# Patient Record
Sex: Male | Born: 1960 | Race: White | Hispanic: No | Marital: Single | Smoking: Never smoker
Health system: Southern US, Community
[De-identification: ages and names within clinical notes are randomized; demographics above are authoritative.]

## PROBLEM LIST (undated history)

## (undated) DIAGNOSIS — I10 Essential (primary) hypertension: Secondary | ICD-10-CM

## (undated) DIAGNOSIS — Q2381 Bicuspid aortic valve: Secondary | ICD-10-CM

## (undated) DIAGNOSIS — E785 Hyperlipidemia, unspecified: Secondary | ICD-10-CM

## (undated) DIAGNOSIS — K449 Diaphragmatic hernia without obstruction or gangrene: Secondary | ICD-10-CM

## (undated) DIAGNOSIS — I517 Cardiomegaly: Secondary | ICD-10-CM

## (undated) DIAGNOSIS — N200 Calculus of kidney: Secondary | ICD-10-CM

## (undated) DIAGNOSIS — K219 Gastro-esophageal reflux disease without esophagitis: Secondary | ICD-10-CM

## (undated) DIAGNOSIS — Q231 Congenital insufficiency of aortic valve: Secondary | ICD-10-CM

## (undated) DIAGNOSIS — E7800 Pure hypercholesterolemia, unspecified: Secondary | ICD-10-CM

## (undated) DIAGNOSIS — Z87442 Personal history of urinary calculi: Secondary | ICD-10-CM

## (undated) HISTORY — DX: Cardiomegaly: I51.7

## (undated) HISTORY — DX: Congenital insufficiency of aortic valve: Q23.1

## (undated) HISTORY — DX: Gastro-esophageal reflux disease without esophagitis: K21.9

## (undated) HISTORY — DX: Essential (primary) hypertension: I10

## (undated) HISTORY — DX: Hyperlipidemia, unspecified: E78.5

## (undated) HISTORY — DX: Pure hypercholesterolemia, unspecified: E78.00

## (undated) HISTORY — PX: CATARACT EXTRACTION: SUR2

## (undated) HISTORY — DX: Calculus of kidney: N20.0

## (undated) HISTORY — DX: Bicuspid aortic valve: Q23.81

## (undated) HISTORY — DX: Diaphragmatic hernia without obstruction or gangrene: K44.9

---

## 2002-06-28 ENCOUNTER — Encounter: Payer: Self-pay | Admitting: *Deleted

## 2002-06-28 ENCOUNTER — Emergency Department (HOSPITAL_COMMUNITY): Admission: EM | Admit: 2002-06-28 | Discharge: 2002-06-29 | Payer: Self-pay | Admitting: Emergency Medicine

## 2005-02-01 ENCOUNTER — Emergency Department (HOSPITAL_COMMUNITY): Admission: EM | Admit: 2005-02-01 | Discharge: 2005-02-01 | Payer: Self-pay

## 2005-11-09 ENCOUNTER — Emergency Department (HOSPITAL_COMMUNITY): Admission: EM | Admit: 2005-11-09 | Discharge: 2005-11-09 | Payer: Self-pay | Admitting: Emergency Medicine

## 2005-11-22 ENCOUNTER — Emergency Department (HOSPITAL_COMMUNITY): Admission: EM | Admit: 2005-11-22 | Discharge: 2005-11-22 | Payer: Self-pay | Admitting: Emergency Medicine

## 2005-11-23 HISTORY — PX: CARDIOVASCULAR STRESS TEST: SHX262

## 2005-11-29 ENCOUNTER — Encounter: Admission: RE | Admit: 2005-11-29 | Discharge: 2005-11-29 | Payer: Self-pay | Admitting: Gastroenterology

## 2006-02-10 ENCOUNTER — Emergency Department (HOSPITAL_COMMUNITY): Admission: EM | Admit: 2006-02-10 | Discharge: 2006-02-10 | Payer: Self-pay | Admitting: Emergency Medicine

## 2006-04-15 ENCOUNTER — Emergency Department (HOSPITAL_COMMUNITY): Admission: EM | Admit: 2006-04-15 | Discharge: 2006-04-15 | Payer: Self-pay | Admitting: Emergency Medicine

## 2006-04-16 ENCOUNTER — Emergency Department (HOSPITAL_COMMUNITY): Admission: EM | Admit: 2006-04-16 | Discharge: 2006-04-16 | Payer: Self-pay | Admitting: Pediatrics

## 2006-07-21 ENCOUNTER — Encounter: Admission: RE | Admit: 2006-07-21 | Discharge: 2006-10-19 | Payer: Self-pay | Admitting: Family Medicine

## 2007-02-23 ENCOUNTER — Ambulatory Visit (HOSPITAL_COMMUNITY): Admission: RE | Admit: 2007-02-23 | Discharge: 2007-02-23 | Payer: Self-pay | Admitting: Neurology

## 2007-03-02 ENCOUNTER — Ambulatory Visit (HOSPITAL_COMMUNITY): Admission: RE | Admit: 2007-03-02 | Discharge: 2007-03-02 | Payer: Self-pay | Admitting: Neurology

## 2007-04-04 ENCOUNTER — Ambulatory Visit (HOSPITAL_BASED_OUTPATIENT_CLINIC_OR_DEPARTMENT_OTHER): Admission: RE | Admit: 2007-04-04 | Discharge: 2007-04-04 | Payer: Self-pay | Admitting: Cardiovascular Disease

## 2007-04-09 ENCOUNTER — Ambulatory Visit: Payer: Self-pay | Admitting: Internal Medicine

## 2007-04-24 ENCOUNTER — Emergency Department (HOSPITAL_COMMUNITY): Admission: EM | Admit: 2007-04-24 | Discharge: 2007-04-24 | Payer: Self-pay | Admitting: Emergency Medicine

## 2007-08-07 ENCOUNTER — Encounter: Admission: RE | Admit: 2007-08-07 | Discharge: 2007-10-02 | Payer: Self-pay | Admitting: Family Medicine

## 2008-02-14 ENCOUNTER — Ambulatory Visit (HOSPITAL_COMMUNITY): Admission: RE | Admit: 2008-02-14 | Discharge: 2008-02-14 | Payer: Self-pay | Admitting: Neurology

## 2009-02-18 ENCOUNTER — Ambulatory Visit (HOSPITAL_COMMUNITY): Admission: RE | Admit: 2009-02-18 | Discharge: 2009-02-18 | Payer: Self-pay | Admitting: Neurology

## 2009-06-23 HISTORY — PX: US ECHOCARDIOGRAPHY: HXRAD669

## 2009-12-29 ENCOUNTER — Ambulatory Visit: Payer: Self-pay | Admitting: Cardiovascular Disease

## 2010-01-05 ENCOUNTER — Ambulatory Visit: Payer: Self-pay | Admitting: Cardiovascular Disease

## 2010-05-19 ENCOUNTER — Ambulatory Visit: Payer: Self-pay | Admitting: Cardiovascular Disease

## 2010-09-01 NOTE — Procedures (Signed)
NAME:  DEV, DHONDT NO.:  [id number]   MEDICAL RECORD NO.:  [id number]           PATIENT TYPE:  OUT   LOCATION:  SLEEP CENTER                 FACILITY:  [facility]   PHYSICIAN:  Clinton D. Maple Hudson, MD, FCCP, FACPDATE OF BIRTH:  17-Jan-1961   DATE OF STUDY:  04/04/2007                            NOCTURNAL POLYSOMNOGRAM   REFERRING PHYSICIAN:  Vesta Mixer, M.D.   REFERRING PHYSICIAN:  Vesta Mixer, M.D.   DATE OF STUDY:  04/04/2007   INDICATION FOR STUDY:  Insomnia with sleep apnea.  Epworth Sleepiness  Score 10/24, BMI 26.  Weight 185 pounds, height 70 inches.  Neck:  16  inches.  Home medications charted and reviewed.   SLEEP ARCHITECTURE:  Total sleep time 255 minutes with sleep efficiency  57%.  Stage 1 was 45%, Stage 2 48%, Stage 3 absent, REM 6% of total  sleep time.  Sleep latency 98 minutes.  REM latency 42 minutes.  Awake  after sleep onset 94 minutes.  Arousal index 6.1 per hour.  Bedtime  medication included Tricor, Niaspan, Lamotrigine, aspirin.   RESPIRATORY DATA:  Apnea/hypopnea index (AHI, RDI) 0 per hour.   OXYGEN DATA:  No snoring noted by the technician.  Oxygen desaturation  nadir was 91% with main oxygen saturation through the study 95.7% on  room air.   CARDIAC DATA:  Mild sinus arrhythmia.   MOVEMENT/PARASOMNIA:  No significant movement disturbance.  No bathroom  trips.   IMPRESSION/RECOMMENDATION:  1. The patient gives a long history of restless sleep quality, and      this study is consistent with history.  There was little movement      disturbance, but deep sleep stages were markedly diminished.      Absent stage of 3 is normal for adults, but he spent only 6% of the      night in REM which is unusual unless there is REM suppressing the      therapy such as an antidepressant.  Total sleep time was short but      not in specific otherwise.  2. No significant sleep disordered breathing with no scorable      respiratory events,  normal oxygenation and no snoring.      Clinton D. Maple Hudson, MD, [facility], FACP  Diplomate, Biomedical engineer of Sleep Medicine  Electronically Signed     CDY/MEDQ  D:  04/09/2007 12:31:26  T:  04/10/2007 11:37:37  Job:  [id number]

## 2010-10-28 ENCOUNTER — Other Ambulatory Visit: Payer: Self-pay | Admitting: *Deleted

## 2010-10-28 DIAGNOSIS — E785 Hyperlipidemia, unspecified: Secondary | ICD-10-CM

## 2010-11-16 ENCOUNTER — Encounter: Payer: Self-pay | Admitting: *Deleted

## 2010-11-16 ENCOUNTER — Other Ambulatory Visit: Payer: Self-pay | Admitting: *Deleted

## 2010-11-16 ENCOUNTER — Ambulatory Visit (INDEPENDENT_AMBULATORY_CARE_PROVIDER_SITE_OTHER): Payer: 59 | Admitting: *Deleted

## 2010-11-16 DIAGNOSIS — E785 Hyperlipidemia, unspecified: Secondary | ICD-10-CM

## 2010-11-16 LAB — BASIC METABOLIC PANEL
BUN: 25 mg/dL — ABNORMAL HIGH (ref 6–23)
CO2: 29 mEq/L (ref 19–32)
Calcium: 9.7 mg/dL (ref 8.4–10.5)
Chloride: 108 mEq/L (ref 96–112)
Creatinine, Ser: 1.3 mg/dL (ref 0.4–1.5)
GFR: 60.37 mL/min (ref >60.00)
Glucose, Bld: 88 mg/dL (ref 70–99)
Potassium: 3.8 mEq/L (ref 3.5–5.1)
Sodium: 143 mEq/L (ref 135–145)

## 2010-11-16 LAB — LIPID PANEL
Cholesterol: 164 mg/dL (ref 0–200)
HDL: 67.1 mg/dL (ref >39.00)
LDL Cholesterol: 82 mg/dL (ref 0–99)
Total CHOL/HDL Ratio: 2
Triglycerides: 75 mg/dL (ref 0.0–149.0)
VLDL: 15 mg/dL (ref 0.0–40.0)

## 2010-11-16 LAB — HEPATIC FUNCTION PANEL
ALT: 35 U/L (ref 0–53)
AST: 33 U/L (ref 0–37)
Albumin: 4.8 g/dL (ref 3.5–5.2)
Alkaline Phosphatase: 78 U/L (ref 39–117)
Bilirubin, Direct: 0.1 mg/dL (ref 0.0–0.3)
Total Bilirubin: 0.7 mg/dL (ref 0.3–1.2)
Total Protein: 7.3 g/dL (ref 6.0–8.3)

## 2010-11-18 ENCOUNTER — Telehealth: Payer: Self-pay | Admitting: Cardiovascular Disease

## 2010-11-18 ENCOUNTER — Ambulatory Visit (INDEPENDENT_AMBULATORY_CARE_PROVIDER_SITE_OTHER): Payer: 59 | Admitting: Cardiovascular Disease

## 2010-11-18 ENCOUNTER — Encounter: Payer: Self-pay | Admitting: Cardiovascular Disease

## 2010-11-18 VITALS — BP 112/72 | HR 57 | Resp 16 | Wt 200.0 lb

## 2010-11-18 DIAGNOSIS — E785 Hyperlipidemia, unspecified: Secondary | ICD-10-CM

## 2010-11-18 DIAGNOSIS — Q231 Congenital insufficiency of aortic valve: Secondary | ICD-10-CM

## 2010-11-18 DIAGNOSIS — I10 Essential (primary) hypertension: Secondary | ICD-10-CM

## 2010-11-18 NOTE — Assessment & Plan Note (Signed)
Louis Delgado's blood pressure is well-controlled. We'll continue with the same medications. He'll continue with the diet and exercise program.

## 2010-11-18 NOTE — Telephone Encounter (Signed)
Mailed lab results. 

## 2010-11-18 NOTE — Progress Notes (Signed)
Louis Delgado Date of Birth  09/12/1960 [facility] / [facility] [address]     [address] [place], [place]  [address] [phone]  Fax  [phone]  History of Present Illness:  Louis Delgado is a middle-aged gentleman with a history of hypertension, bicuspid aortic valve, and hyperlipidemia. He's done well since I last saw him. He does not have any specific complaints.  The patient is doing all of his normal activities without any chest pain or shortness breath.  His last echocardiogram was 2001. He has a bicuspid aortic valve with moderate aortic insufficiency.  Current Outpatient Prescriptions on File Prior to Visit  Medication Sig Dispense Refill  . aspirin 81 MG tablet Take 81 mg by mouth daily.        . cholecalciferol (VITAMIN D) 1000 UNITS tablet Take 2,000 Units by mouth daily.        . finasteride (PROPECIA) 1 MG tablet Take 1 mg by mouth daily.        . fish oil-omega-3 fatty acids 1000 MG capsule Take 1 g by mouth daily.        . LamoTRIgine (LAMICTAL XR) 50 MG TB24 Take 1 tablet by mouth daily.        . metoprolol (TOPROL-XL) 50 MG 24 hr tablet Take 50 mg by mouth daily.        . multivitamin (THERAGRAN) per tablet Take 1 tablet by mouth daily. Heart Healthy       . Niacin, Antihyperlipidemic, (NIASPAN PO) Take 2 g by mouth daily.        Marland Kitchen omeprazole (PRILOSEC) 40 MG capsule Take 40 mg by mouth daily.        . rosuvastatin (CRESTOR) 5 MG tablet Take 5 mg by mouth daily.         Allergies  Allergen Reactions  . Hctz (Hydrochlorothiazide)     Past Medical History  Diagnosis Date  . Hypertension   . Hyperlipidemia   . Bicuspid aortic valve   . Hypercholesterolemia   . Gastroesophageal reflux disease   . Hiatal hernia   . Kidney stones   . Left ventricular hypertrophy     mild  . Chest pain     History reviewed. No pertinent past surgical history.  History  Smoking status  . Never Smoker   Smokeless tobacco  . Never Used     History  Alcohol Use No    Family History  Problem Relation Age of Onset  . Hypertension      Reviw of Systems:  Reviewed in the HPI.  All other systems are negative.  Physical Exam: BP 112/72  Pulse 57  Resp 16  Wt 200 lb (90.719 kg) The patient is alert and oriented x 3.  The mood and affect are normal.   Skin: warm and dry.  Color is normal.    HEENT:   the sclera are nonicteric.  The mucous membranes are moist.  The carotids are 2+ without bruits.  There is no thyromegaly.  There is no JVD.    Lungs: clear.  The chest wall is non tender.    Heart: regular rate with a normal S1 and S2.  There is a soft diastolic murmur at the lateral bord. The PMI is not displaced.     Abdomen: good bowel sounds.  There is no guarding or rebound.  There is no hepatosplenomegaly or tenderness.  There are no masses.   Extremities:  no clubbing, cyanosis, or edema.  The legs are without rashes.  The distal pulses are intact.   Neuro:  Cranial nerves II - XII are intact.  Motor and sensory functions are intact.    The gait is normal.  Assessment / Plan:

## 2010-11-18 NOTE — Assessment & Plan Note (Signed)
Most recent lab work reveals a normal electrolyte profile. His liver functions are normal. His total cholesterol is 164. The triglyceride level of 75. His HDL is 67. His LDL is 82. We will continue with the same medications. All check his lipid profile, hepatic profile, and basic metabolic profile again in 6 months.

## 2010-11-18 NOTE — Telephone Encounter (Signed)
Patient called requesting lab work copy to be mailed.

## 2011-01-06 LAB — URINALYSIS, ROUTINE W REFLEX MICROSCOPIC
Bilirubin Urine: NEGATIVE
Glucose, UA: NEGATIVE
Hgb urine dipstick: NEGATIVE
Ketones, ur: NEGATIVE
Nitrite: NEGATIVE
Protein, ur: NEGATIVE
Specific Gravity, Urine: 1.013
Urobilinogen, UA: 0.2
pH: 6.5

## 2011-01-11 ENCOUNTER — Other Ambulatory Visit: Payer: Self-pay | Admitting: Cardiovascular Disease

## 2011-01-11 NOTE — Telephone Encounter (Signed)
Refilled Meds from fax  

## 2011-03-04 ENCOUNTER — Other Ambulatory Visit: Payer: Self-pay | Admitting: Cardiovascular Disease

## 2011-04-08 ENCOUNTER — Telehealth: Payer: Self-pay | Admitting: Cardiovascular Disease

## 2011-04-08 MED ORDER — ROSUVASTATIN CALCIUM 5 MG PO TABS: 5.0000 mg | ORAL_TABLET | Freq: Every day | ORAL | Status: DC

## 2011-04-08 NOTE — Telephone Encounter (Signed)
New msg Pt wants refill of crestor [facility] [address]

## 2011-04-08 NOTE — Telephone Encounter (Signed)
Fax Received. Refill Completed. Louis Delgado (R.M.A)   

## 2011-04-19 ENCOUNTER — Encounter: Payer: Self-pay | Admitting: *Deleted

## 2011-04-22 ENCOUNTER — Other Ambulatory Visit: Payer: 59 | Admitting: *Deleted

## 2011-04-23 ENCOUNTER — Other Ambulatory Visit (INDEPENDENT_AMBULATORY_CARE_PROVIDER_SITE_OTHER): Payer: 59 | Admitting: *Deleted

## 2011-04-23 DIAGNOSIS — E785 Hyperlipidemia, unspecified: Secondary | ICD-10-CM

## 2011-04-23 LAB — BASIC METABOLIC PANEL
CO2: 27 mEq/L (ref 19–32)
Calcium: 9.3 mg/dL (ref 8.4–10.5)
Chloride: 106 mEq/L (ref 96–112)
Potassium: 3.8 mEq/L (ref 3.5–5.1)
Sodium: 141 mEq/L (ref 135–145)

## 2011-04-23 LAB — LIPID PANEL
HDL: 61.2 mg/dL (ref >39.00)
Total CHOL/HDL Ratio: 3
Triglycerides: 71 mg/dL (ref 0.0–149.0)
VLDL: 14.2 mg/dL (ref 0.0–40.0)

## 2011-04-23 LAB — HEPATIC FUNCTION PANEL
AST: 35 U/L (ref 0–37)
Albumin: 4.3 g/dL (ref 3.5–5.2)

## 2011-04-26 ENCOUNTER — Encounter: Payer: Self-pay | Admitting: Cardiovascular Disease

## 2011-04-26 ENCOUNTER — Ambulatory Visit (INDEPENDENT_AMBULATORY_CARE_PROVIDER_SITE_OTHER): Payer: 59 | Admitting: Cardiovascular Disease

## 2011-04-26 DIAGNOSIS — I10 Essential (primary) hypertension: Secondary | ICD-10-CM

## 2011-04-26 DIAGNOSIS — E785 Hyperlipidemia, unspecified: Secondary | ICD-10-CM

## 2011-04-26 DIAGNOSIS — Q231 Congenital insufficiency of aortic valve: Secondary | ICD-10-CM

## 2011-04-26 MED ORDER — ROSUVASTATIN CALCIUM 5 MG PO TABS: ORAL_TABLET | ORAL | Status: DC

## 2011-04-26 NOTE — Assessment & Plan Note (Signed)
I've encouraged him to continue with the diet and exercise program.

## 2011-04-26 NOTE — Assessment & Plan Note (Signed)
He has a history of bicuspid aortic valve with moderate aortic insufficiency. Overall I think is doing well. We'll plan on doing another echocardiogram in a year or so.

## 2011-04-26 NOTE — Patient Instructions (Signed)
Your physician wants you to follow-up in: 6 months  You will receive a reminder letter in the mail two months in advance. If you don't receive a letter, please call our office to schedule the follow-up appointment.  Your physician recommends that you return for a FASTING lipid profile: 6 months   

## 2011-04-26 NOTE — Progress Notes (Signed)
Louis Delgado Date of Birth  1960/12/23 [facility]     [place] Office  [address]    [address]   [address] [place], [place]  [address]    [place], [place]  [address] [phone]  Fax  [phone]  [phone]  Fax [phone]   History of Present Illness:  Louis Delgado is a 51 y.o. gentleman with a hx of a bicuspid aortic valve, aortic insufficiency, hypertension, and hyperlipidemia. He's done well from a cardiac standpoint. He's not had any episodes of chest pain or shortness of breath. He has not been exercising quite as much as he would like but is looking forward to getting back into his exercise regimen.  Current Outpatient Prescriptions on File Prior to Visit  Medication Sig Dispense Refill  . aspirin 81 MG tablet Take 81 mg by mouth daily.        . cholecalciferol (VITAMIN D) 1000 UNITS tablet Take 2,000 Units by mouth daily.        . fenofibrate micronized (LOFIBRA) 200 MG capsule Take 200 mg by mouth daily before breakfast.        . finasteride (PROPECIA) 1 MG tablet Take 1 mg by mouth daily.        . fish oil-omega-3 fatty acids 1000 MG capsule Take 1 g by mouth daily.        . LamoTRIgine (LAMICTAL XR) 50 MG TB24 Take 1 tablet by mouth daily.        . metoprolol (TOPROL-XL) 50 MG 24 hr tablet TAKE 1 TABLET DAILY    . multivitamin (THERAGRAN) per tablet Take 1 tablet by mouth daily. Heart Healthy       . Niacin, Antihyperlipidemic, (NIASPAN PO) Take 2 g by mouth daily.        Marland Kitchen omeprazole (PRILOSEC) 40 MG capsule TAKE 1 CAPSULE BY MOUTH EVERY DAY    . rosuvastatin (CRESTOR) 5 MG tablet Take 1 tablet (5 mg total) by mouth every other day.      Allergies  Allergen Reactions  . Hctz (Hydrochlorothiazide)     Past Medical History  Diagnosis Date  . Hypertension   . Hyperlipidemia   . Bicuspid aortic valve   . Hypercholesterolemia   . Gastroesophageal reflux disease   . Hiatal hernia   . Kidney stones   . Left ventricular hypertrophy     mild  .  Chest pain     Past Surgical History  Procedure Date  . US echocardiography 06-23-2009    Est EF 55-60%  . Cardiovascular stress test 11-23-2005    EF 55%    History  Smoking status  . Never Smoker   Smokeless tobacco  . Never Used    History  Alcohol Use No    Family History  Problem Relation Age of Onset  . Hypertension      Reviw of Systems:  Reviewed in the HPI.  All other systems are negative.  Physical Exam: BP 143/91  Pulse 75  Ht 5\' 11"  (1.803 m)  Wt 202 lb (91.627 kg)  BMI 28.17 kg/m2 The patient is alert and oriented x 3.  The mood and affect are normal.   Skin: warm and dry.  Color is normal.    HEENT:   He has 2+ carotids. There are no bruits. There is no JVD. His neck is supple. His mucous membranes are moist.  Lungs: Lungs are clear.   Heart: Regular rate S1-S2. He has no significant murmur.  Abdomen:  Good bowel sounds. There is no hepatosplenomegaly.  Extremities:  No clubbing cyanosis or edema.  Neuro:  Neuro exam is nonfocal. His gait is normal.    ECG: Normal sinus rhythm, normal EKG.  Assessment / Plan:

## 2011-06-15 ENCOUNTER — Other Ambulatory Visit: Payer: Self-pay | Admitting: *Deleted

## 2011-06-15 MED ORDER — FENOFIBRATE MICRONIZED 200 MG PO CAPS: 200.0000 mg | ORAL_CAPSULE | Freq: Every day | ORAL | Status: DC

## 2011-08-03 ENCOUNTER — Telehealth: Payer: Self-pay | Admitting: Cardiovascular Disease

## 2011-08-03 NOTE — Telephone Encounter (Signed)
New refill Pt wants refill of niaspan called to [facility] [address].

## 2011-08-03 NOTE — Telephone Encounter (Signed)
Opened in Error.

## 2011-08-05 ENCOUNTER — Telehealth: Payer: Self-pay | Admitting: Cardiovascular Disease

## 2011-08-05 ENCOUNTER — Other Ambulatory Visit: Payer: Self-pay | Admitting: Cardiovascular Disease

## 2011-08-05 MED ORDER — NIACIN ER (ANTIHYPERLIPIDEMIC) 1000 MG PO TBCR: 2000.0000 mg | EXTENDED_RELEASE_TABLET | Freq: Every day | ORAL | Status: DC

## 2011-08-05 NOTE — Telephone Encounter (Signed)
Refill- Niacin, Antihyperlipidemic, (NALSPAN PO)   Tried to refill Niaspan, unable to order via refill screens, presented with a stop as if medication is discontinued.  Patient is completely out of meds, please return call to him at [phone] to advise how this issue will be resolved.  Thank You

## 2011-08-05 NOTE — Telephone Encounter (Signed)
Before we can refill the medication, we need to verify the dosage of the medication. I called the patient, but there was no answer. If he calls back, we need the dosage and directions of the medication

## 2011-08-05 NOTE — Telephone Encounter (Signed)
Louis Delgado called to verify his rx of Niaspan 1000mg , taking 2 daily to equal to 2000mg  or 2G. [facility] [address] and he wll be able to pick this up in about an hour per pharmacist.    Micki Riley, CMA

## 2011-08-06 ENCOUNTER — Telehealth: Payer: Self-pay | Admitting: Cardiovascular Disease

## 2011-08-06 MED ORDER — NIACIN ER (ANTIHYPERLIPIDEMIC) 1000 MG PO TBCR: 2000.0000 mg | EXTENDED_RELEASE_TABLET | Freq: Every day | ORAL | Status: DC

## 2011-08-06 NOTE — Telephone Encounter (Signed)
PT MEDS WERE SET ON PRINT FOR REFILL, MED REORDERED.

## 2011-08-06 NOTE — Telephone Encounter (Signed)
New msg Pt wants to talk to you about his meds. Please call him back

## 2011-08-16 ENCOUNTER — Other Ambulatory Visit: Payer: Self-pay | Admitting: *Deleted

## 2011-08-16 MED ORDER — OMEPRAZOLE 40 MG PO CPDR: 40.0000 mg | DELAYED_RELEASE_CAPSULE | Freq: Every day | ORAL | Status: DC

## 2011-08-16 NOTE — Telephone Encounter (Signed)
Fax Received. Refill Completed. Johanna Stafford Chowoe (R.M.A)   

## 2011-08-30 ENCOUNTER — Other Ambulatory Visit: Payer: Self-pay | Admitting: *Deleted

## 2011-08-30 ENCOUNTER — Other Ambulatory Visit: Payer: Self-pay | Admitting: Cardiovascular Disease

## 2011-08-30 MED ORDER — NIACIN ER (ANTIHYPERLIPIDEMIC) 1000 MG PO TBCR: 2000.0000 mg | EXTENDED_RELEASE_TABLET | Freq: Every day | ORAL | Status: DC

## 2011-08-30 NOTE — Telephone Encounter (Signed)
PT MAY NEED PA FOR INSURANCE CO BUT HE WAS UNSURE AND I HAVEN'T RECEIVED ANYTHING YET. PT MADE AWARE/ HAS MEDS AVAILABLE STILL AND WILL RE CONTACT IF FURTHER ASSISTANCE NEEDED.

## 2011-08-30 NOTE — Telephone Encounter (Signed)
Refill-Niacin (NIASPAN) 1000 MG CR tablet  [facility] [address] [place], [place].  Patient request return call at [phone] regarding refills as he seems to be encountering a number of problems each time he attempts to renew meds every month.

## 2011-10-26 ENCOUNTER — Other Ambulatory Visit: Payer: 59

## 2011-11-02 ENCOUNTER — Ambulatory Visit: Payer: 59 | Admitting: Cardiovascular Disease

## 2011-11-25 ENCOUNTER — Other Ambulatory Visit (INDEPENDENT_AMBULATORY_CARE_PROVIDER_SITE_OTHER): Payer: 59

## 2011-11-25 ENCOUNTER — Telehealth: Payer: Self-pay | Admitting: Cardiovascular Disease

## 2011-11-25 DIAGNOSIS — Q231 Congenital insufficiency of aortic valve: Secondary | ICD-10-CM

## 2011-11-25 DIAGNOSIS — I10 Essential (primary) hypertension: Secondary | ICD-10-CM

## 2011-11-25 DIAGNOSIS — E785 Hyperlipidemia, unspecified: Secondary | ICD-10-CM

## 2011-11-25 LAB — HEPATIC FUNCTION PANEL
ALT: 65 U/L — ABNORMAL HIGH (ref 0–53)
AST: 52 U/L — ABNORMAL HIGH (ref 0–37)
Albumin: 4.3 g/dL (ref 3.5–5.2)
Alkaline Phosphatase: 95 U/L (ref 39–117)
Bilirubin, Direct: 0 mg/dL (ref 0.0–0.3)
Total Protein: 7 g/dL (ref 6.0–8.3)

## 2011-11-25 LAB — BASIC METABOLIC PANEL
CO2: 27 mEq/L (ref 19–32)
Glucose, Bld: 90 mg/dL (ref 70–99)
Potassium: 3.9 mEq/L (ref 3.5–5.1)
Sodium: 139 mEq/L (ref 135–145)

## 2011-11-25 LAB — LIPID PANEL: Total CHOL/HDL Ratio: 3

## 2011-11-25 NOTE — Telephone Encounter (Signed)
Walk in pt Form " pt Dropped Off [facility]" sent to jodette  11/25/11/KM

## 2011-11-30 ENCOUNTER — Telehealth: Payer: Self-pay | Admitting: Cardiovascular Disease

## 2011-11-30 NOTE — Telephone Encounter (Signed)
Pt rtn call re test results, pls call has questions [phone]

## 2011-11-30 NOTE — Telephone Encounter (Signed)
lmtcb

## 2011-11-30 NOTE — Telephone Encounter (Signed)
Results of cholesterol given and has ov this week.

## 2011-12-02 ENCOUNTER — Encounter: Payer: Self-pay | Admitting: Cardiovascular Disease

## 2011-12-02 ENCOUNTER — Ambulatory Visit (INDEPENDENT_AMBULATORY_CARE_PROVIDER_SITE_OTHER): Payer: 59 | Admitting: Cardiovascular Disease

## 2011-12-02 VITALS — BP 138/90 | HR 64 | Ht 71.0 in | Wt 204.4 lb

## 2011-12-02 DIAGNOSIS — I10 Essential (primary) hypertension: Secondary | ICD-10-CM

## 2011-12-02 DIAGNOSIS — E785 Hyperlipidemia, unspecified: Secondary | ICD-10-CM

## 2011-12-02 DIAGNOSIS — Q231 Congenital insufficiency of aortic valve: Secondary | ICD-10-CM

## 2011-12-02 MED ORDER — METOPROLOL SUCCINATE ER 50 MG PO TB24: 50.0000 mg | ORAL_TABLET | Freq: Every day | ORAL | Status: DC

## 2011-12-02 NOTE — Assessment & Plan Note (Signed)
His lipid levels are well controlled but his last liver enzymes are mildly elevated. We'll check his fasting lipids again in 3 months. Will make a decision whether or not we can continue the Crestor that time. I'll plan on seeing him again in 6 months for office visit, fasting labs, EKG.

## 2011-12-02 NOTE — Progress Notes (Signed)
Louis Delgado Date of Birth  05-02-1960       [facility]    [place] [address]  [address] [place], [place]  [address]   [place], [place]  [address] [phone]     [phone]   Fax  [phone]    Fax [phone]  Problem List: 1. Hypertension 2. Hyperlipidemia 3. Bicuspid aortic valve  History of Present Illness: Louis Delgado is a 51 y.o. gentleman with a hx of a bicuspid aortic valve, aortic insufficiency, hypertension, and hyperlipidemia. He's done well from a cardiac standpoint. He's not had any episodes of chest pain or shortness of breath. He has not been exercising quite as much as he would like but is looking forward to getting back into his exercise regimen.  He has not been getting as much exercise that he would like and he has gained some weight.   He has gained about 5 pounds.   He is otherwise feeling well.  Current Outpatient Prescriptions on File Prior to Visit  Medication Sig Dispense Refill  . aspirin 81 MG tablet Take 81 mg by mouth daily.        . cholecalciferol (VITAMIN D) 1000 UNITS tablet Take 2,000 Units by mouth daily.        . fenofibrate micronized (LOFIBRA) 200 MG capsule Take 1 capsule (200 mg total) by mouth daily before breakfast.  30 capsule  6  . finasteride (PROPECIA) 1 MG tablet Take 1 mg by mouth daily.        . niacin (NIASPAN) 1000 MG CR tablet Take 2 tablets (2,000 mg total) by mouth at bedtime.  60 tablet  5  . omeprazole (PRILOSEC) 40 MG capsule Take 1 capsule (40 mg total) by mouth daily.  30 capsule  5  . rosuvastatin (CRESTOR) 5 MG tablet Take one tablet every other day  30 tablet  5  . DISCONTD: metoprolol (TOPROL-XL) 50 MG 24 hr tablet TAKE 1 TABLET DAILY  90 tablet  2    Allergies  Allergen Reactions  . Hctz (Hydrochlorothiazide)     Past Medical History  Diagnosis Date  . Hypertension   . Hyperlipidemia   . Bicuspid aortic valve     moderate aortic isufficiency  .  Hypercholesterolemia   . Gastroesophageal reflux disease   . Hiatal hernia   . Kidney stones   . Left ventricular hypertrophy     mild  . Chest pain     Past Surgical History  Procedure Date  . US echocardiography 06-23-2009    Est EF 55-60%  . Cardiovascular stress test 11-23-2005    EF 55%    History  Smoking status  . Never Smoker   Smokeless tobacco  . Never Used    History  Alcohol Use No    Family History  Problem Relation Age of Onset  . Hypertension      Reviw of Systems:  Reviewed in the HPI.  All other systems are negative.  Physical Exam: Blood pressure 138/90, pulse 64, height 5\' 11"  (1.803 m), weight 204 lb 6.4 oz (92.715 kg), SpO2 98.00%. General: Well developed, well nourished, in no acute distress.  Head: Normocephalic, atraumatic, sclera non-icteric, mucus membranes are moist,   Neck: Supple. Carotids are 2 + without bruits. No JVD  Lungs: Clear bilaterally to auscultation.  Heart: regular rate.  normal  S1 S2. No murmurs, gallops or rubs.  Abdomen: Soft, non-tender, non-distended with normal bowel sounds. No hepatomegaly.  No rebound/guarding. No masses.  Msk:  Strength and tone are normal  Extremities: No clubbing or cyanosis. No edema.  Distal pedal pulses are 2+ and equal bilaterally.  Neuro: Alert and oriented X 3. Moves all extremities spontaneously.  Psych:  Responds to questions appropriately with a normal affect.  ECG:  Assessment / Plan:

## 2011-12-02 NOTE — Patient Instructions (Signed)
Your physician wants you to follow-up in: 6 MONTHS You will receive a reminder letter in the mail two months in advance. If you don't receive a letter, please call our office to schedule the follow-up appointment.  Your physician recommends that you return for a FASTING lipid profile: 3  AND 6 MONTHS

## 2011-12-02 NOTE — Assessment & Plan Note (Signed)
He has a bicuspid aortic valve. We'll, getting another echocardiogram around 2015.

## 2011-12-02 NOTE — Assessment & Plan Note (Signed)
Louis Delgado's blood pressure is a little elevated today. I suspect this because of some weight gain and excess salt in the diet. I've asked him to get back in to the gym on a more frequent basis. I've asked him to lose weight. He'll keep a record of his blood pressure readings. We will review these in 6 months. We may start him on a low-dose diuretic or ACE inhibitor if his blood pressure continues to be mildly elevated.

## 2012-01-03 ENCOUNTER — Telehealth: Payer: Self-pay | Admitting: Cardiovascular Disease

## 2012-01-03 NOTE — Telephone Encounter (Signed)
Left message to call back  

## 2012-01-03 NOTE — Telephone Encounter (Signed)
New problem:   please advise   niaspan is on back order. Out of medication. Looking for samples.

## 2012-01-03 NOTE — Telephone Encounter (Signed)
Samples left at front desk for patient lot # [id number] exp 2/15, advised patient

## 2012-01-03 NOTE — Telephone Encounter (Signed)
Pt rtn call, pls call  °

## 2012-01-12 ENCOUNTER — Other Ambulatory Visit: Payer: Self-pay | Admitting: Cardiovascular Disease

## 2012-01-12 MED ORDER — NIACIN ER (ANTIHYPERLIPIDEMIC) 1000 MG PO TBCR: 2000.0000 mg | EXTENDED_RELEASE_TABLET | Freq: Every day | ORAL | Status: DC

## 2012-01-12 NOTE — Telephone Encounter (Signed)
Pt called and stated that Niaspan is no longer available, is ok to use generic Rx niacin?  Caralee Ates, CMA  Called the pt back and informed him that per Alfonso Ramus, RN ok to use generic (Niaspan )niacin 1000 MG - take 2 tablets at bedtime. I also informed him that I would call his pharmacy with new Rx. Caralee Ates, CMA  [phone] and gave verbal ok for generic Rx niacin in place of the [facility]) with the same instructions -- take 2 tablets (2000 MG total) at bedtime, disp 60 tablets with 5 refills.  Caralee Ates, CMA

## 2012-01-16 ENCOUNTER — Other Ambulatory Visit: Payer: Self-pay | Admitting: Cardiovascular Disease

## 2012-01-17 NOTE — Telephone Encounter (Signed)
New Problem:   P Lch:  Patient called in needing a refill of his fenofibrate micronized (LOFIBRA) 200 MG capsule.   Jodette:  Patient called in because he wanted to know if he needed to wait until the generic of his niacin (NIASPAN) 1000 MG CR tablet or go purchase the over the counter, time release supplement that is currently available. Please call back.

## 2012-01-17 NOTE — Telephone Encounter (Signed)
Please see message, please call work number.

## 2012-01-17 NOTE — Telephone Encounter (Signed)
Pt was told he could take otc niacin suppliment but we have not heard yet when prescription strength niaspan will be filled from back order, please review and advise.

## 2012-01-18 NOTE — Telephone Encounter (Signed)
Pt told ok to take supplement.

## 2012-02-11 ENCOUNTER — Other Ambulatory Visit: Payer: Self-pay | Admitting: *Deleted

## 2012-02-11 MED ORDER — OMEPRAZOLE 40 MG PO CPDR: 40.0000 mg | DELAYED_RELEASE_CAPSULE | Freq: Every day | ORAL | Status: DC

## 2012-02-15 ENCOUNTER — Other Ambulatory Visit: Payer: Self-pay | Admitting: *Deleted

## 2012-02-15 MED ORDER — ROSUVASTATIN CALCIUM 5 MG PO TABS: ORAL_TABLET | ORAL | Status: DC

## 2012-02-28 ENCOUNTER — Telehealth: Payer: Self-pay | Admitting: Cardiovascular Disease

## 2012-02-28 DIAGNOSIS — Z0000 Encounter for general adult medical examination without abnormal findings: Secondary | ICD-10-CM

## 2012-02-28 NOTE — Telephone Encounter (Signed)
psa ordered per pt request, msg left telling  him I didn't see any history of prostate problems and I will put the order under a well exam, asked him to call back if I need to add a historical problem.

## 2012-02-28 NOTE — Telephone Encounter (Signed)
Pt pcp wants to know if we can add a PSA test to his labs for next week

## 2012-03-07 ENCOUNTER — Other Ambulatory Visit: Payer: Self-pay | Admitting: Cardiovascular Disease

## 2012-03-07 ENCOUNTER — Telehealth: Payer: Self-pay | Admitting: *Deleted

## 2012-03-07 MED ORDER — METOPROLOL SUCCINATE ER 50 MG PO TB24: 50.0000 mg | ORAL_TABLET | Freq: Every day | ORAL | Status: DC

## 2012-03-07 NOTE — Telephone Encounter (Signed)
New problem;   Need re-authorization .

## 2012-03-07 NOTE — Telephone Encounter (Signed)
Called his ins [facility] prior auth line for toprol xl 50 mg daily, no prior auth needed per [place] [facility]. Pt informed, script sent to refill.

## 2012-03-07 NOTE — Telephone Encounter (Signed)
Patient would like for Office to Prior Auth his Toprol Xl 50mg  po QD with [facility]. [facility]. Patient is Aware. For Patient to set up mail order account their Customer Service number is [phone]. Prior Auth number is [id number].

## 2012-03-09 ENCOUNTER — Ambulatory Visit (INDEPENDENT_AMBULATORY_CARE_PROVIDER_SITE_OTHER): Payer: 59 | Admitting: *Deleted

## 2012-03-09 DIAGNOSIS — E785 Hyperlipidemia, unspecified: Secondary | ICD-10-CM

## 2012-03-09 LAB — HEPATIC FUNCTION PANEL
AST: 47 U/L — ABNORMAL HIGH (ref 0–37)
Albumin: 4.6 g/dL (ref 3.5–5.2)
Alkaline Phosphatase: 107 U/L (ref 39–117)
Total Protein: 7.4 g/dL (ref 6.0–8.3)

## 2012-03-09 LAB — LIPID PANEL
Cholesterol: 172 mg/dL (ref 0–200)
Total CHOL/HDL Ratio: 3
Triglycerides: 85 mg/dL (ref 0.0–149.0)

## 2012-03-09 LAB — BASIC METABOLIC PANEL
Calcium: 9.7 mg/dL (ref 8.4–10.5)
Creatinine, Ser: 1.2 mg/dL (ref 0.4–1.5)

## 2012-03-14 ENCOUNTER — Telehealth: Payer: Self-pay | Admitting: Cardiovascular Disease

## 2012-03-14 ENCOUNTER — Encounter: Payer: Self-pay | Admitting: *Deleted

## 2012-03-14 NOTE — Telephone Encounter (Signed)
pcp was changed, copy mailed to pt and sent to pcp. I also included a MYCHART activation letter.

## 2012-03-14 NOTE — Telephone Encounter (Signed)
Pt would like a copy of his labs and the PSA test that was done and he also would like a copy sent to his PCP at [facility] at [facility] [address]  Dr. Minda Ditto

## 2012-06-15 ENCOUNTER — Ambulatory Visit (INDEPENDENT_AMBULATORY_CARE_PROVIDER_SITE_OTHER): Payer: 59 | Admitting: Cardiovascular Disease

## 2012-06-15 ENCOUNTER — Encounter: Payer: Self-pay | Admitting: Cardiovascular Disease

## 2012-06-15 VITALS — BP 104/82 | HR 62 | Ht 71.0 in | Wt 207.0 lb

## 2012-06-15 DIAGNOSIS — E785 Hyperlipidemia, unspecified: Secondary | ICD-10-CM

## 2012-06-15 DIAGNOSIS — I10 Essential (primary) hypertension: Secondary | ICD-10-CM

## 2012-06-15 DIAGNOSIS — Q231 Congenital insufficiency of aortic valve: Secondary | ICD-10-CM

## 2012-06-15 MED ORDER — NIACIN ER (ANTIHYPERLIPIDEMIC) 1000 MG PO TBCR: 2000.0000 mg | EXTENDED_RELEASE_TABLET | Freq: Every day | ORAL | Status: DC

## 2012-06-15 NOTE — Assessment & Plan Note (Signed)
His last lipid levels were quite good. He's currently on Crestor. He has mentioned some tingling in his fingertips and toes. It's possible that this do to the Crestor. I would like for him to see a neurologist to see if this type of neuropathy is consistent with a statin caused neuropathy. He'll call his back after that evaluation. We'll check labs again in 6 months.

## 2012-06-15 NOTE — Assessment & Plan Note (Signed)
His is exam is stable.  Will get an echo in 1 year.

## 2012-06-15 NOTE — Progress Notes (Signed)
Louis Delgado Date of Birth  [birth year]       [facility]    [place] [address]  [address], [address] [place], [place]  [address]   [place], [place]  [address] [phone]     [phone]   Fax  [phone]    Fax [phone]  Problem List: 1. Hypertension 2. Hyperlipidemia 3. Bicuspid aortic valve  History of Present Illness: Louis Delgado is a 52 y.o. gentleman with a hx of a bicuspid aortic valve, aortic insufficiency, hypertension, and hyperlipidemia. He's done well from a cardiac standpoint. He's not had any episodes of chest pain or shortness of breath. He has not been exercising quite as much as he would like but is looking forward to getting back into his exercise regimen.  He has not been getting as much exercise that he would like and he has gained some weight.   He has gained about 5 pounds.   He is otherwise feeling well.  Feb. 27, 2014 Louis Delgado is doing well from a cardiac standpoint.  He has been having some tingling and numbness in his fingers and toes.    It typically resolved if he moves his hands around.  No CP or dyspnea.    Current Outpatient Prescriptions on File Prior to Visit  Medication Sig Dispense Refill  . aspirin 81 MG tablet Take 81 mg by mouth daily.        . cholecalciferol (VITAMIN D) 1000 UNITS tablet Take 2,000 Units by mouth daily.        . fenofibrate micronized (LOFIBRA) 200 MG capsule TAKE 1 CAPSULE (200 MG TOTAL) BY MOUTH DAILY BEFORE BREAKFAST.  30 capsule  6  . finasteride (PROPECIA) 1 MG tablet Take 1 mg by mouth daily.        . metoprolol succinate (TOPROL-XL) 50 MG 24 hr tablet Take 1 tablet (50 mg total) by mouth daily. Take with or immediately following a meal.  90 tablet  3  . niacin (NIASPAN) 1000 MG CR tablet Take 2 tablets (2,000 mg total) by mouth at bedtime.  60 tablet  5  . omeprazole (PRILOSEC) 40 MG capsule Take 1 capsule (40 mg total) by mouth daily.  30 capsule  5  . rosuvastatin (CRESTOR) 5 MG  tablet Take one tablet every other day  30 tablet  5   No current facility-administered medications on file prior to visit.    Allergies  Allergen Reactions  . Hctz (Hydrochlorothiazide)     Past Medical History  Diagnosis Date  . Hypertension   . Hyperlipidemia   . Bicuspid aortic valve     moderate aortic isufficiency  . Hypercholesterolemia   . Gastroesophageal reflux disease   . Hiatal hernia   . Kidney stones   . Left ventricular hypertrophy     mild  . Chest pain     Past Surgical History  Procedure Laterality Date  . US echocardiography  06-23-2009    Est EF 55-60%  . Cardiovascular stress test  11-23-2005    EF 55%    History  Smoking status  . Never Smoker   Smokeless tobacco  . Never Used    History  Alcohol Use No    Family History  Problem Relation Age of Onset  . Hypertension      Reviw of Systems:  Reviewed in the HPI.  All other systems are negative.  Physical Exam: Blood pressure 104/82, pulse 62, height 5\' 11"  (1.803  m), weight 207 lb (93.895 kg). General: Well developed, well nourished, in no acute distress.  Head: Normocephalic, atraumatic, sclera non-icteric, mucus membranes are moist,   Neck: Supple. Carotids are 2 + without bruits. No JVD  Lungs: Clear bilaterally to auscultation.  Heart: regular rate.  normal  S1 S2. He has a soft systolic murmur.  Abdomen: Soft, non-tender, non-distended with normal bowel sounds. No hepatomegaly. No rebound/guarding. No masses.  Msk:  Strength and tone are normal  Extremities: No clubbing or cyanosis. No edema.  Distal pedal pulses are 2+ and equal bilaterally.  Neuro: Alert and oriented X 3. Moves all extremities spontaneously.  Psych:  Responds to questions appropriately with a normal affect.  ECG: 06/15/2012: Normal sinus rhythm at 62. He has no ST or T wave changes.   Assessment / Plan:

## 2012-06-15 NOTE — Assessment & Plan Note (Signed)
Hs BP is better.  Continue with current meds.

## 2012-06-15 NOTE — Patient Instructions (Addendum)
Your physician wants you to follow-up in: 6 months You will receive a reminder letter in the mail two months in advance. If you don't receive a letter, please call our office to schedule the follow-up appointment.  Your physician recommends that you return for a FASTING lipid profile: 6 months   Your physician recommends that you continue on your current medications as directed. Please refer to the Current Medication list given to you today.  

## 2012-07-17 ENCOUNTER — Telehealth: Payer: Self-pay | Admitting: Cardiovascular Disease

## 2012-07-17 DIAGNOSIS — E785 Hyperlipidemia, unspecified: Secondary | ICD-10-CM

## 2012-07-17 MED ORDER — NIACIN ER (ANTIHYPERLIPIDEMIC) 1000 MG PO TBCR: 2000.0000 mg | EXTENDED_RELEASE_TABLET | Freq: Every day | ORAL | Status: DC

## 2012-07-17 NOTE — Telephone Encounter (Signed)
Spoke to patient he stated he needs a refill for niacin.Refill sent to [facility].

## 2012-07-17 NOTE — Telephone Encounter (Signed)
New problem   Pt first stated he needed an prior auth for his meds then he changed it to he need a  refill on his medication for Niacin 1000mg . He stated pharmacy has called twice with no response from office, so pt called concerning this matter

## 2012-07-18 ENCOUNTER — Telehealth: Payer: Self-pay

## 2012-07-18 NOTE — Telephone Encounter (Signed)
Received a call from patient he stated the wrong niacin was sent to pharmacy.States he wants the over the counter niacin.Spoke to pharmacist at [facility] over the counter niacin 1000 mg take  2 tablets every night # 60 refill x 6.

## 2012-07-20 ENCOUNTER — Telehealth: Payer: Self-pay | Admitting: *Deleted

## 2012-07-20 NOTE — Telephone Encounter (Signed)
PT REQUESTED OTC FOR NIACIN PER DR NAHSER HE DOES NOT RECOMMEND OTC DUE TO FLUSHING, IF HE DOES NOT WANT TO STAY ON RX HE SAID HE COULD STOP NIACIN, ASKED PT TO CALL BACK IF HE DECIDES TO STOP IT SO HIS MAR CAN BE ADJUSTED. NUMBER PROVIDED.

## 2012-08-14 ENCOUNTER — Other Ambulatory Visit: Payer: Self-pay | Admitting: *Deleted

## 2012-08-14 MED ORDER — FENOFIBRATE MICRONIZED 200 MG PO CAPS: 200.0000 mg | ORAL_CAPSULE | Freq: Every day | ORAL | Status: DC

## 2012-08-14 NOTE — Telephone Encounter (Signed)
Fax Received. Refill Completed. Louis Delgado (R.M.A)   

## 2012-08-21 ENCOUNTER — Other Ambulatory Visit: Payer: Self-pay | Admitting: *Deleted

## 2012-08-21 MED ORDER — OMEPRAZOLE 40 MG PO CPDR: 40.0000 mg | DELAYED_RELEASE_CAPSULE | Freq: Every day | ORAL | Status: DC

## 2012-08-21 NOTE — Telephone Encounter (Signed)
Left message on refill line to refill Omeprazole to [facility]

## 2012-08-30 ENCOUNTER — Encounter: Payer: Self-pay | Admitting: Cardiovascular Disease

## 2012-11-20 ENCOUNTER — Telehealth: Payer: Self-pay | Admitting: Cardiovascular Disease

## 2012-11-20 MED ORDER — METOPROLOL TARTRATE 25 MG PO TABS: 25.0000 mg | ORAL_TABLET | Freq: Two times a day (BID) | ORAL | Status: DC

## 2012-11-20 NOTE — Telephone Encounter (Signed)
Pt to switch to metoprolol tart 25 mg bid/ per Norma Fredrickson np, pt informed/ script sent.

## 2012-11-20 NOTE — Telephone Encounter (Signed)
New prob  Pt states that his metoprolol er is no longer covered with his insurance. He said that it only covers the metopolol sr. He said that it cost 3 times the amount he using paid. He said he has not refilled.

## 2012-11-23 ENCOUNTER — Ambulatory Visit (INDEPENDENT_AMBULATORY_CARE_PROVIDER_SITE_OTHER): Payer: 59 | Admitting: *Deleted

## 2012-11-23 DIAGNOSIS — E785 Hyperlipidemia, unspecified: Secondary | ICD-10-CM

## 2012-11-23 DIAGNOSIS — Q2381 Bicuspid aortic valve: Secondary | ICD-10-CM

## 2012-11-23 DIAGNOSIS — Q231 Congenital insufficiency of aortic valve: Secondary | ICD-10-CM

## 2012-11-23 DIAGNOSIS — I10 Essential (primary) hypertension: Secondary | ICD-10-CM

## 2012-11-23 LAB — LIPID PANEL
Cholesterol: 155 mg/dL (ref 0–200)
LDL Cholesterol: 77 mg/dL (ref 0–99)
Triglycerides: 92 mg/dL (ref 0.0–149.0)

## 2012-11-27 ENCOUNTER — Ambulatory Visit (INDEPENDENT_AMBULATORY_CARE_PROVIDER_SITE_OTHER): Payer: 59 | Admitting: Cardiovascular Disease

## 2012-11-27 ENCOUNTER — Encounter: Payer: Self-pay | Admitting: Cardiovascular Disease

## 2012-11-27 VITALS — BP 114/74 | HR 67 | Ht 71.0 in | Wt 209.0 lb

## 2012-11-27 DIAGNOSIS — E785 Hyperlipidemia, unspecified: Secondary | ICD-10-CM

## 2012-11-27 DIAGNOSIS — Q231 Congenital insufficiency of aortic valve: Secondary | ICD-10-CM

## 2012-11-27 DIAGNOSIS — I10 Essential (primary) hypertension: Secondary | ICD-10-CM

## 2012-11-27 NOTE — Assessment & Plan Note (Signed)
His valve sounds stable.  

## 2012-11-27 NOTE — Assessment & Plan Note (Signed)
His lipid levels have all improved.

## 2012-11-27 NOTE — Progress Notes (Signed)
Louis Delgado Date of Birth  [birth year]       [facility]    [place] [address]  [address] [place], [place]  [address]   [place], [place]  [address] [phone]     [phone]   Fax  [phone]    Fax [phone]  Problem List: 1. Hypertension 2. Hyperlipidemia 3. Bicuspid aortic valve  History of Present Illness: Louis Delgado is a 52 y.o. gentleman with a hx of a bicuspid aortic valve, aortic insufficiency, hypertension, and hyperlipidemia. He's done well from a cardiac standpoint. He's not had any episodes of chest pain or shortness of breath. He has not been exercising quite as much as he would like but is looking forward to getting back into his exercise regimen.  He has not been getting as much exercise that he would like and he has gained some weight.   He has gained about 5 pounds.   He is otherwise feeling well.  Feb. 27, 2014 Louis Delgado is doing well from a cardiac standpoint.  He has been having some tingling and numbness in his fingers and toes.    It typically resolved if he moves his hands around.  No CP or dyspnea.  November 27, 2012:  Louis Delgado is doing well. He's having any episodes of chest pain or shortness breath. He has bicuspid aortic valve/aortic insufficiency that we have been following. He's not having any symptoms related to the aortic insufficiency. His blood pressure and heart rate at been well-controlled.    Current Outpatient Prescriptions on File Prior to Visit  Medication Sig Dispense Refill  . aspirin 81 MG tablet Take 81 mg by mouth daily.        . cholecalciferol (VITAMIN D) 1000 UNITS tablet Take 2,000 Units by mouth daily.        . fenofibrate micronized (LOFIBRA) 200 MG capsule Take 1 capsule (200 mg total) by mouth daily before breakfast.  30 capsule  6  . finasteride (PROPECIA) 1 MG tablet Take 1 mg by mouth daily.        . niacin (NIASPAN) 1000 MG CR tablet Take 2 tablets (2,000 mg total) by mouth at  bedtime.  60 tablet  6  . omeprazole (PRILOSEC) 40 MG capsule Take 1 capsule (40 mg total) by mouth daily.  30 capsule  5  . rosuvastatin (CRESTOR) 5 MG tablet Take one tablet every other day  30 tablet  5  . metoprolol tartrate (LOPRESSOR) 25 MG tablet Take 1 tablet (25 mg total) by mouth 2 (two) times daily.  180 tablet  3   No current facility-administered medications on file prior to visit.    Allergies  Allergen Reactions  . Hctz (Hydrochlorothiazide)     Past Medical History  Diagnosis Date  . Hypertension   . Hyperlipidemia   . Bicuspid aortic valve     moderate aortic isufficiency  . Hypercholesterolemia   . Gastroesophageal reflux disease   . Hiatal hernia   . Kidney stones   . Left ventricular hypertrophy     mild  . Chest pain     Past Surgical History  Procedure Laterality Date  . US echocardiography  06-23-2009    Est EF 55-60%  . Cardiovascular stress test  11-23-2005    EF 55%    History  Smoking status  . Never Smoker   Smokeless tobacco  . Never Used    History  Alcohol Use No  Family History  Problem Relation Age of Onset  . Hypertension      Reviw of Systems:  Reviewed in the HPI.  All other systems are negative.  Physical Exam: Blood pressure 114/74, pulse 67, height 5\' 11"  (1.803 m), weight 209 lb (94.802 kg), SpO2 96.00%. General: Well developed, well nourished, in no acute distress.  Head: Normocephalic, atraumatic, sclera non-icteric, mucus membranes are moist,   Neck: Supple. Carotids are 2 + without bruits. No JVD  Lungs: Clear bilaterally to auscultation.  Heart: regular rate.  normal  S1 S2. He has a soft systolic murmur.  Abdomen: Soft, non-tender, non-distended with normal bowel sounds. No hepatomegaly. No rebound/guarding. No masses.  Msk:  Strength and tone are normal  Extremities: No clubbing or cyanosis. No edema.  Distal pedal pulses are 2+ and equal bilaterally.  Neuro: Alert and oriented X 3. Moves all  extremities spontaneously.  Psych:  Responds to questions appropriately with a normal affect.  ECG: .   Assessment / Plan:

## 2012-11-27 NOTE — Patient Instructions (Addendum)
Your physician wants you to follow-up in: 6 months You will receive a reminder letter in the mail two months in advance. If you don't receive a letter, please call our office to schedule the follow-up appointment.  Your physician recommends that you return for a FASTING lipid profile: 6 months   Your physician recommends that you continue on your current medications as directed. Please refer to the Current Medication list given to you today.  

## 2012-11-27 NOTE — Assessment & Plan Note (Signed)
Stable

## 2012-12-05 ENCOUNTER — Telehealth: Payer: Self-pay | Admitting: *Deleted

## 2012-12-05 NOTE — Telephone Encounter (Signed)
Pt needs TEE and Ct angio in near future, I will forward to Dr Elease Hashimoto for further instructions.

## 2012-12-26 ENCOUNTER — Other Ambulatory Visit: Payer: Self-pay

## 2012-12-26 MED ORDER — ROSUVASTATIN CALCIUM 5 MG PO TABS: ORAL_TABLET | ORAL | Status: DC

## 2013-01-01 ENCOUNTER — Other Ambulatory Visit: Payer: Self-pay

## 2013-01-01 MED ORDER — ROSUVASTATIN CALCIUM 5 MG PO TABS: ORAL_TABLET | ORAL | Status: DC

## 2013-01-01 NOTE — Telephone Encounter (Signed)
PT NEEDS TO START WITH TEE / DX; BICUSPID VALVE PER DR Elease Hashimoto

## 2013-01-01 NOTE — Telephone Encounter (Signed)
msg left/ looking at 01/11/13 for a TEE, told him I will call him Wednesday to see if he can do it then.

## 2013-01-03 ENCOUNTER — Telehealth: Payer: Self-pay | Admitting: Cardiovascular Disease

## 2013-01-03 ENCOUNTER — Encounter: Payer: Self-pay | Admitting: *Deleted

## 2013-01-03 NOTE — Telephone Encounter (Signed)
No answer/ will continue to try to make contact.

## 2013-01-03 NOTE — Telephone Encounter (Signed)
Pt scheduled for tee/ see letter, 01/11/13 10:00 am pt verbalized understanding.

## 2013-01-03 NOTE — Telephone Encounter (Signed)
lmtcb

## 2013-01-03 NOTE — Telephone Encounter (Signed)
See letter for tee/ done

## 2013-01-03 NOTE — Telephone Encounter (Signed)
New problem  Louis Delgado, Louis Delgado is returning your call AV:[id number] next week.

## 2013-01-11 ENCOUNTER — Ambulatory Visit (HOSPITAL_COMMUNITY)
Admission: RE | Admit: 2013-01-11 | Discharge: 2013-01-11 | Disposition: A | Discharge status: Home / Self Care | Payer: 59 | Source: Ambulatory Visit | Attending: Cardiovascular Disease | Admitting: Cardiovascular Disease

## 2013-01-11 ENCOUNTER — Encounter (HOSPITAL_COMMUNITY): Payer: Self-pay

## 2013-01-11 ENCOUNTER — Encounter (HOSPITAL_COMMUNITY)
Admission: RE | Disposition: A | Discharge status: Home / Self Care | Payer: Self-pay | Source: Ambulatory Visit | Attending: Cardiovascular Disease

## 2013-01-11 DIAGNOSIS — I10 Essential (primary) hypertension: Secondary | ICD-10-CM | POA: Insufficient documentation

## 2013-01-11 DIAGNOSIS — I359 Nonrheumatic aortic valve disorder, unspecified: Secondary | ICD-10-CM | POA: Insufficient documentation

## 2013-01-11 DIAGNOSIS — Q231 Congenital insufficiency of aortic valve: Secondary | ICD-10-CM

## 2013-01-11 DIAGNOSIS — E785 Hyperlipidemia, unspecified: Secondary | ICD-10-CM | POA: Insufficient documentation

## 2013-01-11 HISTORY — PX: TEE WITHOUT CARDIOVERSION: SHX5443

## 2013-01-11 SURGERY — ECHOCARDIOGRAM, TRANSESOPHAGEAL
Anesthesia: Moderate Sedation

## 2013-01-11 MED ORDER — MIDAZOLAM HCL 10 MG/2ML IJ SOLN
INTRAMUSCULAR | Status: DC | PRN
  Administered 2013-01-11 (×2): 2 mg via INTRAVENOUS
  Administered 2013-01-11: 1 mg via INTRAVENOUS

## 2013-01-11 MED ORDER — SODIUM CHLORIDE 0.9 % IV SOLN
INTRAVENOUS | Status: DC
  Administered 2013-01-11: 10:00:00 via INTRAVENOUS

## 2013-01-11 MED ORDER — BUTAMBEN-TETRACAINE-BENZOCAINE 2-2-14 % EX AERO
INHALATION_SPRAY | CUTANEOUS | Status: DC | PRN
  Administered 2013-01-11: 2 via TOPICAL

## 2013-01-11 MED ORDER — FENTANYL CITRATE 0.05 MG/ML IJ SOLN
INTRAMUSCULAR | Status: DC | PRN
  Administered 2013-01-11: 25 ug via INTRAVENOUS
  Administered 2013-01-11: 50 ug via INTRAVENOUS

## 2013-01-11 MED ORDER — FENTANYL CITRATE 0.05 MG/ML IJ SOLN
INTRAMUSCULAR | Status: AC
  Filled 2013-01-11: qty 2

## 2013-01-11 MED ORDER — MIDAZOLAM HCL 5 MG/ML IJ SOLN
INTRAMUSCULAR | Status: AC
  Filled 2013-01-11: qty 2

## 2013-01-11 NOTE — H&P (Signed)
Louis Delgado Date of Birth              11-24-1960        [facility]                                       [place] [address]                   [address] [place], [place]  [address]                                  [place], [place]  [address] [phone]                                                  [phone]   Fax  [phone]                                          Fax [phone]   Problem List: 1. Hypertension 2. Hyperlipidemia 3. Bicuspid aortic valve   History of Present Illness: Louis Delgado is a 52 y.o. gentleman with a hx of a bicuspid aortic valve, aortic insufficiency, hypertension, and hyperlipidemia. He's done well from a cardiac standpoint. He's not had any episodes of chest pain or shortness of breath. He has not been exercising quite as much as he would like but is looking forward to getting back into his exercise regimen.   He has not been getting as much exercise that he would like and he has gained some weight.   He has gained about 5 pounds.   He is otherwise feeling well.   Feb. 27, 2014 Louis Delgado is doing well from a cardiac standpoint.  He has been having some tingling and numbness in his fingers and toes.    It typically resolved if he moves his hands around.  No CP or dyspnea.   November 27, 2012:   Louis Delgado is doing well. He's having any episodes of chest pain or shortness breath. He has bicuspid aortic valve/aortic insufficiency that we have been following. He's not having any symptoms related to the aortic insufficiency. His blood pressure and heart rate at been well-controlled.  Sept. 25, 2014:  Louis Delgado returns for TEE.  We have had concerns that he has a bicuspid AV.    We may have to do a CT angio of his aorta.  He has done well.     Current Outpatient Prescriptions on File Prior to Visit   Medication  Sig  Dispense  Refill   .  aspirin 81 MG tablet  Take 81 mg by mouth daily.           .  cholecalciferol  (VITAMIN D) 1000 UNITS tablet  Take 2,000 Units by mouth daily.           .  fenofibrate micronized (LOFIBRA) 200 MG capsule  Take 1 capsule (200 mg total) by mouth daily before breakfast.   30 capsule   6   .  finasteride (PROPECIA) 1 MG tablet  Take 1 mg  by mouth daily.           .  niacin (NIASPAN) 1000 MG CR tablet  Take 2 tablets (2,000 mg total) by mouth at bedtime.   60 tablet   6   .  omeprazole (PRILOSEC) 40 MG capsule  Take 1 capsule (40 mg total) by mouth daily.   30 capsule   5   .  rosuvastatin (CRESTOR) 5 MG tablet  Take one tablet every other day   30 tablet   5   .  metoprolol tartrate (LOPRESSOR) 25 MG tablet  Take 1 tablet (25 mg total) by mouth 2 (two) times daily.   180 tablet   3       No current facility-administered medications on file prior to visit.    Allergies   Allergen  Reactions   .  Hctz (Hydrochlorothiazide)         Past Medical History   Diagnosis  Date   .  Hypertension     .  Hyperlipidemia     .  Bicuspid aortic valve         moderate aortic isufficiency   .  Hypercholesterolemia     .  Gastroesophageal reflux disease     .  Hiatal hernia     .  Kidney stones     .  Left ventricular hypertrophy         mild   .  Chest pain         Past Surgical History   Procedure  Laterality  Date   .  US echocardiography    06-23-2009       Est EF 55-60%   .  Cardiovascular stress test    11-23-2005       EF 55%       History   Smoking status   .  Never Smoker    Smokeless tobacco   .  Never Used       History   Alcohol Use  No      Family History   Problem  Relation  Age of Onset   .  Hypertension         Reviw of Systems:   Reviewed in the HPI.  All other systems are negative.   Physical Exam: Blood pressure 114/74, pulse 67, height 5\' 11"  (1.803 m), weight 209 lb (94.802 kg), SpO2 96.00%. General: Well developed, well nourished, in no acute distress.   Head: Normocephalic, atraumatic, sclera non-icteric, mucus membranes are moist,     Neck: Supple. Carotids are 2 + without bruits. No JVD  Lungs: Clear bilaterally to auscultation.  Heart: regular rate.  normal  S1 S2. He has a soft systolic murmur.  Abdomen: Soft, non-tender, non-distended with normal bowel sounds. No hepatomegaly. No rebound/guarding. No masses.  Msk:  Strength and tone are normal  Extremities: No clubbing or cyanosis. No edema.  Distal pedal pulses are 2+ and equal bilaterally.  Neuro: Alert and oriented X 3. Moves all extremities spontaneously.   Psych:  Responds to questions appropriately with a normal affect.   ECG: .  Assessment / Plan:           Bicuspid aortic valve - his last echo is strongly suggestive of bicuspid AV.  Will proceed with TEE to verify.  Risks, benefits have been explained.     Hyperlipidemia -           His lipid levels have all improved.  Vesta Mixer, Montez Hageman., MD, [facility] 01/11/2013, 10:23 AM Office - [phone] Pager [phone]

## 2013-01-11 NOTE — CV Procedure (Signed)
    Transesophageal Echocardiogram Note  Louis Delgado [id number] [birth year]  Procedure: Transesophageal Echocardiogram Indications: ? Of Bicuspid AV  Procedure Details Consent: Obtained Time Out: Verified patient identification, verified procedure, site/side was marked, verified correct patient position, special equipment/implants available, Radiology Safety Procedures followed,  medications/allergies/relevent history reviewed, required imaging and test results available.  Performed  Medications: Fentanyl: 75 mcg IV Versed: 5 mg IV  Left Ventrical:  Normal LV function  Mitral Valve: normal,  Insignificant MR  Aortic Valve: 3 leaflet AV, mild - moderate AI  Tricuspid Valve: trace TR  Pulmonic Valve: normal  Left Atrium/ Left atrial appendage: normal, no thrombus  Atrial septum: intace  Aorta: normal, normal size   Complications: No apparent complications Patient did tolerate procedure well.   Vesta Mixer, Montez Hageman., MD, [facility] 01/11/2013, 10:38 AM

## 2013-01-11 NOTE — Progress Notes (Signed)
Echocardiogram Echocardiogram Transesophageal has been performed.  Dorothey Baseman 01/11/2013, 10:07 AM

## 2013-01-11 NOTE — Interval H&P Note (Signed)
History and Physical Interval Note:  01/11/2013 10:24 AM  Louis Delgado  has presented today for surgery, with the diagnosis of BICUSPID VALVE  The various methods of treatment have been discussed with the patient and family. After consideration of risks, benefits and other options for treatment, the patient has consented to  Procedure(s): TRANSESOPHAGEAL ECHOCARDIOGRAM (TEE) (N/A) as a surgical intervention .  The patient's history has been reviewed, patient examined, no change in status, stable for surgery.  I have reviewed the patient's chart and labs.  Questions were answered to the patient's satisfaction.     [name].

## 2013-01-12 ENCOUNTER — Encounter (HOSPITAL_COMMUNITY): Payer: Self-pay | Admitting: Cardiovascular Disease

## 2013-01-12 ENCOUNTER — Telehealth: Payer: Self-pay | Admitting: Cardiovascular Disease

## 2013-01-12 NOTE — Telephone Encounter (Signed)
lmovm to call back.

## 2013-01-12 NOTE — Telephone Encounter (Signed)
Patient called back. States he was groggy yesterday when he received his TEE results. Reviewed report with patient. Advised will send message to Dr.Nahser concerning any change in therapy and when to be seen again/next echo or TEE. He is aware that he will get a call back again next week.

## 2013-01-12 NOTE — Telephone Encounter (Signed)
New problem    TEE on yesterday @ [facility] .    Patient calling back regarding test results.

## 2013-01-23 NOTE — Telephone Encounter (Signed)
Will forward to Dr. Nahser. °

## 2013-01-23 NOTE — Telephone Encounter (Signed)
Good news.  The TEE showed that the aortic valve is a 3 leaflet valve.  He does have aortic insufficiency which we will continue to follow yearly.

## 2013-01-23 NOTE — Telephone Encounter (Signed)
Follow up    Patient wants to hear from the MD regarding his test results -TEE.

## 2013-01-24 NOTE — Telephone Encounter (Signed)
Pt was informed/ pt accepting of update and agreeable to yearly plan.

## 2013-02-07 ENCOUNTER — Other Ambulatory Visit: Payer: Self-pay

## 2013-02-07 DIAGNOSIS — E785 Hyperlipidemia, unspecified: Secondary | ICD-10-CM

## 2013-02-07 MED ORDER — NIACIN ER (ANTIHYPERLIPIDEMIC) 1000 MG PO TBCR: 2000.0000 mg | EXTENDED_RELEASE_TABLET | Freq: Every day | ORAL | Status: DC

## 2013-02-19 ENCOUNTER — Other Ambulatory Visit: Payer: Self-pay | Admitting: Cardiovascular Disease

## 2013-03-11 ENCOUNTER — Other Ambulatory Visit: Payer: Self-pay | Admitting: Cardiovascular Disease

## 2013-05-15 ENCOUNTER — Telehealth: Payer: Self-pay | Admitting: *Deleted

## 2013-05-15 NOTE — Telephone Encounter (Signed)
PA to [employer] [facility] for fenofibrate

## 2013-05-22 ENCOUNTER — Telehealth: Payer: Self-pay | Admitting: *Deleted

## 2013-05-22 NOTE — Telephone Encounter (Signed)
Patients fenofibrate 200 mg denied by [employer] [facility], patient must have tried step therapy of fenofibrate 160 mg or fenofibrate 54 mg. Note to  Dr Elease HashimotoNahser.

## 2013-05-23 ENCOUNTER — Telehealth: Payer: Self-pay | Admitting: *Deleted

## 2013-05-23 MED ORDER — FENOFIBRATE 160 MG PO TABS: 160.0000 mg | ORAL_TABLET | Freq: Every day | ORAL | Status: DC

## 2013-05-23 NOTE — Telephone Encounter (Signed)
Left msg stating his insurance will not cover fenofibrate 200 mg/ called in fenofibrate 160 mg/ ins states it will cover it.

## 2013-05-24 NOTE — Telephone Encounter (Signed)
Message copied by Carmela HurtADAMS, KIMBERLY G on Thu May 24, 2013  2:37 PM ------      Message from: Vesta MixerNAHSER, PHILIP J      Created: Wed May 23, 2013  3:59 PM      Regarding: RE: medication denied by insurer       Fenofibrate 160 a day is fine.                  ----- Message -----         From: Carmela HurtKimberly G Adams, RN         Sent: 05/22/2013   3:26 PM           To: Vesta MixerPhilip J Nahser, MD, Carmela HurtKimberly G Adams, RN      Subject: medication denied by insurer                             Patients fenofibrate 200 mg denied by [employer] [facility], patient must have tried step therapy of fenofibrate 160 mg or fenofibrate 54 mg first?            Thanks, Addison LankKim Adams, RN Patient Care Advocate       ------

## 2013-05-25 ENCOUNTER — Other Ambulatory Visit (INDEPENDENT_AMBULATORY_CARE_PROVIDER_SITE_OTHER): Payer: 59

## 2013-05-25 DIAGNOSIS — E785 Hyperlipidemia, unspecified: Secondary | ICD-10-CM

## 2013-05-28 LAB — BASIC METABOLIC PANEL
BUN: 20 mg/dL (ref 6–23)
CHLORIDE: 107 meq/L (ref 96–112)
CO2: 25 mEq/L (ref 19–32)
Calcium: 9.4 mg/dL (ref 8.4–10.5)
Creatinine, Ser: 1.1 mg/dL (ref 0.4–1.5)
GFR: 73.65 mL/min (ref >60.00)
Glucose, Bld: 85 mg/dL (ref 70–99)
Potassium: 4 mEq/L (ref 3.5–5.1)
SODIUM: 140 meq/L (ref 135–145)

## 2013-05-28 LAB — LIPID PANEL
Cholesterol: 183 mg/dL (ref 0–200)
HDL: 65.6 mg/dL (ref >39.00)
LDL CALC: 99 mg/dL (ref 0–99)
Total CHOL/HDL Ratio: 3
Triglycerides: 93 mg/dL (ref 0.0–149.0)
VLDL: 18.6 mg/dL (ref 0.0–40.0)

## 2013-05-28 LAB — HEPATIC FUNCTION PANEL
ALT: 90 U/L — AB (ref 0–53)
AST: 72 U/L — ABNORMAL HIGH (ref 0–37)
Albumin: 4.2 g/dL (ref 3.5–5.2)
Alkaline Phosphatase: 149 U/L — ABNORMAL HIGH (ref 39–117)
BILIRUBIN TOTAL: 0.7 mg/dL (ref 0.3–1.2)
Bilirubin, Direct: 0.1 mg/dL (ref 0.0–0.3)
Total Protein: 6.9 g/dL (ref 6.0–8.3)

## 2013-05-30 ENCOUNTER — Encounter: Payer: Self-pay | Admitting: Cardiovascular Disease

## 2013-05-30 ENCOUNTER — Ambulatory Visit (INDEPENDENT_AMBULATORY_CARE_PROVIDER_SITE_OTHER): Payer: 59 | Admitting: Cardiovascular Disease

## 2013-05-30 VITALS — BP 110/80 | HR 61 | Ht 71.0 in | Wt 211.0 lb

## 2013-05-30 DIAGNOSIS — I351 Nonrheumatic aortic (valve) insufficiency: Secondary | ICD-10-CM

## 2013-05-30 DIAGNOSIS — Q2381 Bicuspid aortic valve: Secondary | ICD-10-CM

## 2013-05-30 DIAGNOSIS — E785 Hyperlipidemia, unspecified: Secondary | ICD-10-CM

## 2013-05-30 DIAGNOSIS — I10 Essential (primary) hypertension: Secondary | ICD-10-CM

## 2013-05-30 DIAGNOSIS — I359 Nonrheumatic aortic valve disorder, unspecified: Secondary | ICD-10-CM

## 2013-05-30 DIAGNOSIS — Q231 Congenital insufficiency of aortic valve: Secondary | ICD-10-CM

## 2013-05-30 NOTE — Patient Instructions (Signed)
Your physician wants you to follow-up in: ONE YEAR WITH DR NAHSER You will receive a reminder letter in the mail two months in advance. If you don't receive a letter, please call our office to schedule the follow-up appointment.  

## 2013-05-30 NOTE — Progress Notes (Signed)
Clement Husbandsaymond Wiginton Date of Birth  09/06/1960       [facility]    [place] [facility] [address], [address]  [address], [address] [place], [place]  [id number]   [place], [place]  [id number] [phone]     [phone]   Fax  [phone]    Fax [phone]  Problem List: 1. Hypertension 2. Hyperlipidemia   History of Present Illness: Ty is a 53 y.o. gentleman with a hx of a bicuspid aortic valve, aortic insufficiency, hypertension, and hyperlipidemia. He's done well from a cardiac standpoint. He's not had any episodes of chest pain or shortness of breath. He has not been exercising quite as much as he would like but is looking forward to getting back into his exercise regimen.  He has not been getting as much exercise that he would like and he has gained some weight.   He has gained about 5 pounds.   He is otherwise feeling well.  Feb. 27, 2014 Ty is doing well from a cardiac standpoint.  He has been having some tingling and numbness in his fingers and toes.    It typically resolved if he moves his hands around.  No CP or dyspnea.  November 27, 2012:  Ty is doing well. He's having any episodes of chest pain or shortness breath. He has bicuspid aortic valve/aortic insufficiency that we have been following. He's not having any symptoms related to the aortic insufficiency. His blood pressure and heart rate at been well-controlled.  Feb. 11, 2015:  Ty is doing OK.   His TEE showed moderate AI but a 3 leaflet aortic valve.    Getting out and exercising.   His recent labs showed good lipid levels.        Current Outpatient Prescriptions on File Prior to Visit  Medication Sig Dispense Refill  . aspirin 81 MG tablet Take 81 mg by mouth daily.        . cholecalciferol (VITAMIN D) 1000 UNITS tablet Take 2,000 Units by mouth daily.        . fenofibrate 160 MG tablet Take 1 tablet (160 mg total) by mouth daily. Before breakfast  90 tablet  1  . metoprolol tartrate  (LOPRESSOR) 25 MG tablet Take 1 tablet (25 mg total) by mouth 2 (two) times daily.  180 tablet  3  . niacin (NIASPAN) 1000 MG CR tablet Take 2 tablets (2,000 mg total) by mouth at bedtime.  60 tablet  6  . omeprazole (PRILOSEC) 40 MG capsule TAKE 1 CAPSULE (40 MG TOTAL) BY MOUTH DAILY.  30 capsule  5  . rosuvastatin (CRESTOR) 5 MG tablet Take one tablet every other day  30 tablet  5   No current facility-administered medications on file prior to visit.    Allergies  Allergen Reactions  . Hctz [Hydrochlorothiazide]     Past Medical History  Diagnosis Date  . Hypertension   . Hyperlipidemia   . Bicuspid aortic valve     moderate aortic isufficiency  . Hypercholesterolemia   . Gastroesophageal reflux disease   . Hiatal hernia   . Kidney stones   . Left ventricular hypertrophy     mild  . Chest pain     Past Surgical History  Procedure Laterality Date  . [place] echocardiography  06-23-2009    Est EF 55-60%  . Cardiovascular stress test  11-23-2005    EF 55%  . Cataract extraction Bilateral November & December 2013  .  Tee without cardioversion N/A 01/11/2013    Procedure: TRANSESOPHAGEAL ECHOCARDIOGRAM (TEE);  Surgeon: Vesta Mixer, MD;  Location: [facility] ENDOSCOPY;  Service: Cardiovascular;  Laterality: N/A;    History  Smoking status  . Never Smoker   Smokeless tobacco  . Never Used    History  Alcohol Use No    Family History  Problem Relation Age of Onset  . Hypertension      Reviw of Systems:  Reviewed in the HPI.  All other systems are negative.  Physical Exam: Blood pressure 110/80, pulse 61, height 5\' 11"  (1.803 m), weight 211 lb (95.709 kg). General: Well developed, well nourished, in no acute distress.  Head: Normocephalic, atraumatic, sclera non-icteric, mucus membranes are moist,   Neck: Supple. Carotids are 2 + without bruits. No JVD  Lungs: Clear bilaterally to auscultation.  Heart: regular rate.  normal  S1 S2. He has a soft systolic  murmur.  Abdomen: Soft, non-tender, non-distended with normal bowel sounds. No hepatomegaly. No rebound/guarding. No masses.  Msk:  Strength and tone are normal  Extremities: No clubbing or cyanosis. No edema.  Distal pedal pulses are 2+ and equal bilaterally.  Neuro: Alert and oriented X 3. Moves all extremities spontaneously.  Psych:  Responds to questions appropriately with a normal affect.  ECG: Feb. 11, 2015:  NSR at 37.  No St or T wave changes   Assessment / Plan:

## 2013-05-30 NOTE — Assessment & Plan Note (Signed)
He has moderate aortic insufficiency. His valve remain stable. He was clearly seen to be a 3 leaflet valve during the recent transesophageal echo.

## 2013-05-30 NOTE — Assessment & Plan Note (Signed)
His lipids are stable

## 2013-05-30 NOTE — Assessment & Plan Note (Signed)
His blood pressure is well-controlled. Continue with same medications.

## 2013-05-31 ENCOUNTER — Telehealth: Payer: Self-pay | Admitting: *Deleted

## 2013-05-31 ENCOUNTER — Encounter: Payer: Self-pay | Admitting: *Deleted

## 2013-05-31 DIAGNOSIS — E785 Hyperlipidemia, unspecified: Secondary | ICD-10-CM

## 2013-05-31 NOTE — Telephone Encounter (Signed)
Message copied by Barrie FolkGRAY, Jasira Robinson F on Thu May 31, 2013 10:39 AM ------      Message from: Vesta MixerNAHSER, PHILIP J      Created: Thu May 31, 2013  5:31 AM       In reviewing his labs, I notice that his liver enzymes are elevated.  He is on combination  Of crestor and fenofibrate.  Please refer him to lipid clinic ------

## 2013-06-07 ENCOUNTER — Ambulatory Visit (INDEPENDENT_AMBULATORY_CARE_PROVIDER_SITE_OTHER): Payer: 59 | Admitting: Pharmacist

## 2013-06-07 VITALS — Wt 210.0 lb

## 2013-06-07 DIAGNOSIS — Z79899 Other long term (current) drug therapy: Secondary | ICD-10-CM

## 2013-06-07 DIAGNOSIS — E785 Hyperlipidemia, unspecified: Secondary | ICD-10-CM

## 2013-06-07 MED ORDER — FISH OIL 1000 MG PO CPDR: 2000.0000 mg | DELAYED_RELEASE_CAPSULE | Freq: Every day | ORAL | Status: DC

## 2013-06-07 NOTE — Patient Instructions (Signed)
1.  Stop fenofibrate completely. 2. Continue Crestor 5 mg - 4 times per week. 3.  Conitnue niacin ER 2,000 mg at night. 4.  Start fish oil 1,000 mg capsules - take 2 per day.  Nordic Naturals or Carlson's fish oil recommended if possible (Earthfare, Wholefoods, Vitamin Shoppe).  G[employer] brand otherwise.  5.  Get back to working out at gym 3 days per week. 6.  Avoid fried foods, french fries at lunch - get salad instead. 7.  Recheck cholesterol / liver in 8 weeks (fasting 07/31/13), see Riki RuskJeremy 2 days later (08/02/13 at 8:30)

## 2013-06-07 NOTE — Progress Notes (Signed)
Patient is a pleasant 53 y.o. WM referred to lipid clinic by Dr. Elease HashimotoNahser given elevated LFTs on Crestor qod, fenofibrate 160 mg qd, and niacin 2 g qhs.  Currently tolerating regimen with no complaints.  He states that ~ 20 years he was put on lipid lowering meds at another practice but he ultimately stopped them.  He doesn't recall why, but he tells me he never had muscle aches with any lipid lowering meds. He took Crestor at higher doses everyday in the past, but he though it was causing some "memory fog" so this was stopped and ultimately reduced to 5 mg qod which he is tolerating well currently.  Patient's TG are currently < 100 mg/dL and LDL is 99 mg/dL.  LFTs have slowly been trending up over past 18 months.  He admits that his weight has been trending up slowly over past 6-12 months as he hasn't been as active.  He does carry his weight in the abdomen, and he has an abdominal U/S done in 02/2007 which showed possible fatty liver.  In 05/2013 ALT 90 / AST 72.   He took fish oil years ago, but due to reflux he stopped.  He states he didn't have this issue with [employer] brand fish oil, but he stopped it on his own a long time ago.  LDL goal < 100 mg/dL, non-HDL goal < 161130 mg/dL Meds:  Crestor 5 mg qod (4 times a week per patient), Fenofibrate 160 mg qd, Niacin 2 g qhs Intolerant:  Daily higher dose Crestor (memory fog) Patient thinks he may have taken other medications ~ 15-20 years ago when he was being treated at another practice, but he doesn't recall any other statins.  He is sure that he never developed muscle aches on meds in the past.  Social history:  Rarely drinks alcohol.  No tobacco use. Diet:  Breakfast - oatmeal or cereal in the mornings.  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  He does get some fried foods and fries periodically.  Dinner he eats a meat and vegetable from home.  Rarely drinks soda.  Periodically eats dessert, but not large servings of anything.  His worst  meal is lunch.   Exercise:  Was working out a the [employer] 3-4 days per week, however ~ 4-6 months ago he stopped going regularly ("lazziness" per patient), and now only going maybe once weekly.  He plans on restarting 3 days per week.  Labs:  05/2013 - TC 183, TG 93, HDL 66, LDL 99, BMET normal, ALT 90, AST 72 (Crestor 5 mg qod, Fenofibrate 160 mg qd, Niacin 2 g qhs; gained weight over past few months) 02/2012 - ALT 56, AST 47 (Crestor 5 mg qod, Fenofibrate 160 mg qd, Niacin 2 g qhs)  Current Outpatient Prescriptions  Medication Sig Dispense Refill  . aspirin 81 MG tablet Take 81 mg by mouth daily.        . cholecalciferol (VITAMIN D) 1000 UNITS tablet Take 2,000 Units by mouth daily.        . fenofibrate 160 MG tablet Take 1 tablet (160 mg total) by mouth daily. Before breakfast  90 tablet  1  . metoprolol tartrate (LOPRESSOR) 25 MG tablet Take 1 tablet (25 mg total) by mouth 2 (two) times daily.  180 tablet  3  . niacin (NIASPAN) 1000 MG CR tablet Take 2 tablets (2,000 mg total) by mouth at bedtime.  60 tablet  6  .  omeprazole (PRILOSEC) 40 MG capsule TAKE 1 CAPSULE (40 MG TOTAL) BY MOUTH DAILY.  30 capsule  5  . rosuvastatin (CRESTOR) 5 MG tablet Take one tablet every other day  30 tablet  5   No current facility-administered medications for this visit.   Allergies  Allergen Reactions  . Crestor [Rosuvastatin]     Possible memory issue when taking Crestor daily at high dose  . Hctz [Hydrochlorothiazide]    Family History  Problem Relation Age of Onset  . Hypertension

## 2013-06-07 NOTE — Assessment & Plan Note (Addendum)
Patient tolerating current regimen of Crestor 5 mg qod, Fenofibrate 160 mg qd, Niacin 2 g qhs well, however given TG normal and no h/o of insulin resistance, I think it is reasonable to d/c fenofibrate.  This may potentially help lower LFTs as well.  His mild LFT elevation could very well be due to his recent 5-6 lb weight gain in his abdominal region, especially given the possible fatty liver on abdominal U/S in 02/2007.  Given he is tolerating Crestor 5 mg qod will continue this.  If TG remain normal off fenofibrate, may be able to consider reducing prescription niacin dose as well.  Patient is interested in restarting fish oil again which I think is reasonable and may keep any large TG changes once stopping fenofibrate.  Hopefully by cutting out fried foods, restarting gym workout 3-4 days per week, and stopping fenofibrate, he will keep LDL and non-HDL at goal, but get his LFTs back into normal limits.  Will recheck blood work in 8 weeks. Plan: 1.  Stop fenofibrate completely. 2. Continue Crestor 5 mg - 4 times per week. 3.  Continue niacin ER 2,000 mg at night. 4.  Start fish oil 1,000 mg capsules - take 2 per day.  Nordic Naturals or Carlson's fish oil recommended if possible (Earthfare, Wholefoods, Vitamin Shoppe).  [employer] brand otherwise.  5.  Get back to working out at gym 3 days per week. 6.  Avoid fried foods, french fries at lunch - get salad instead. 7.  Recheck cholesterol / liver in 8 weeks (fasting 07/31/13), see Riki RuskJeremy 2 days later (08/02/13 at 8:30)

## 2013-07-31 ENCOUNTER — Other Ambulatory Visit (INDEPENDENT_AMBULATORY_CARE_PROVIDER_SITE_OTHER): Payer: 59

## 2013-07-31 DIAGNOSIS — Z79899 Other long term (current) drug therapy: Secondary | ICD-10-CM

## 2013-07-31 DIAGNOSIS — E785 Hyperlipidemia, unspecified: Secondary | ICD-10-CM

## 2013-07-31 LAB — HEPATIC FUNCTION PANEL
ALBUMIN: 4.1 g/dL (ref 3.5–5.2)
ALT: 103 U/L — ABNORMAL HIGH (ref 0–53)
AST: 52 U/L — ABNORMAL HIGH (ref 0–37)
Alkaline Phosphatase: 188 U/L — ABNORMAL HIGH (ref 39–117)
Bilirubin, Direct: 0.1 mg/dL (ref 0.0–0.3)
TOTAL PROTEIN: 6.9 g/dL (ref 6.0–8.3)
Total Bilirubin: 0.5 mg/dL (ref 0.3–1.2)

## 2013-07-31 LAB — LIPID PANEL
CHOLESTEROL: 174 mg/dL (ref 0–200)
HDL: 60.5 mg/dL (ref >39.00)
LDL Cholesterol: 93 mg/dL (ref 0–99)
TRIGLYCERIDES: 104 mg/dL (ref 0.0–149.0)
Total CHOL/HDL Ratio: 3
VLDL: 20.8 mg/dL (ref 0.0–40.0)

## 2013-08-02 ENCOUNTER — Ambulatory Visit (INDEPENDENT_AMBULATORY_CARE_PROVIDER_SITE_OTHER): Payer: 59 | Admitting: Pharmacist

## 2013-08-02 DIAGNOSIS — E785 Hyperlipidemia, unspecified: Secondary | ICD-10-CM

## 2013-08-02 DIAGNOSIS — Z79899 Other long term (current) drug therapy: Secondary | ICD-10-CM

## 2013-08-02 MED ORDER — NIACIN ER (ANTIHYPERLIPIDEMIC) 1000 MG PO TBCR
1000.0000 mg | EXTENDED_RELEASE_TABLET | Freq: Every day | ORAL | Status: DC
Start: 2013-08-02 — End: 2013-12-06

## 2013-08-02 NOTE — Patient Instructions (Signed)
1.  Stop current niacin formulation (Slo-niacin ? ) - may be contributing to elevated liver enzymes. 2.  Start Niaspan (prescription) at just 1,000 mg once daily in the evening. 3.  Continue Crestor 5mg  four times per week. 4.  Continue fish oil 2 per day. 5.  Recheck liver enzymes in 6 weeks (09/12/13 - non-fasting lab) 6.  Recheck lipid function and cholesterol in 4 months (fasting labs 12/04/13), then see Louis Delgado 2 days later on 12/06/13 at 8:30 am

## 2013-08-02 NOTE — Assessment & Plan Note (Addendum)
Patient will stop current niacin product (? slo-niacin) as this could likely be contributing to his LFTs.  Will have him restart Niaspan but at only 1 g qhs given excellent panel currently.  Continue all other meds.  Will recheck LFTs in 6 weeks, and recheck lipid / liver in 4 months.   Plan: 1.  Stop current niacin formulation (Slo-niacin ? ) - may be contributing to elevated liver enzymes. 2.  Start Niaspan (prescription) at just 1,000 mg once daily in the evening. 3.  Continue Crestor 5mg  four times per week. 4.  Continue fish oil 2 per day. 5.  Recheck liver enzymes in 6 weeks (09/12/13 - non-fasting lab) 6.  Recheck lipid function and cholesterol in 4 months (fasting labs 12/04/13), then see Riki RuskJeremy 2 days later on 12/06/13 at 8:30 am

## 2013-08-02 NOTE — Progress Notes (Signed)
Patient is a pleasant 53 y.o. WM referred to lipid clinic by Dr. Elease HashimotoNahser given elevated LFTs on Crestor qod, fenofibrate 160 mg qd, and niacin 2 g qhs.  We stopped his fenofibrate a few months ago given great lipid panel, and cholesterol remains controlled.  LFTs remain stable however ~ 80-100 U/L.  I did discuss with him more about the formulation of niacin he was taking, and he tells me that ~ 18 months ago he couldn't get his niaspan from pharmacy (on back order), so the pharmacy apparently filled it with niacin ER (Slo-niacin).  He has been taking this apparently for past 18 months.  Slo-niacin has been shown to increase LFTs as it doesn't fully metabolize nicotinic acid (via amidation).  This could be reason for elevated LFTs as they have been slightly elevated for past 18 months.    He states that ~ 20 years he was put on lipid lowering meds at another practice but he ultimately stopped them.  He doesn't recall why, but he tells me he never had muscle aches with any lipid lowering meds. He took Crestor at higher doses everyday in the past, but he though it was causing some "memory fog" so this was stopped and ultimately reduced to 5 mg qod which he is tolerating well currently.    He admits that his weight has been trending up slowly over past 6-12 months as he hasn't been as active.  He does carry his weight in the abdomen, and he has an abdominal U/S done in 02/2007 which showed possible fatty liver.  In 05/2013 ALT 90 / AST 72, and now 07/2013 ALT 103 / AST 52.   He took fish oil years ago, but due to reflux he stopped.  He states he didn't have this issue with [employer] brand fish oil, but he stopped it on his own a long time ago.  LDL goal < 100 mg/dL, non-HDL goal < 409130 mg/dL Meds:  Crestor 5 mg qod (4 times a week per patient), fish oil 2 g/d, Niacin 2 g qhs Intolerant:  Daily higher dose Crestor (memory fog) Patient thinks he may have taken other medications ~ 15-20 years ago when he was being treated at  another practice, but he doesn't recall any other statins.  He is sure that he never developed muscle aches on meds in the past.  Social history:  Rarely drinks alcohol.  No tobacco use. Diet:  No change over past 3 months.  Breakfast - oatmeal or cereal in the mornings.  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  He does get some fried foods and fries periodically.  Dinner he eats a meat and vegetable from home.  Rarely drinks soda.  Periodically eats dessert, but not large servings of anything.  His worst meal is lunch.   Exercise:  Was working out a the [employer] 3-4 days per week, however ~ 4-6 months ago he stopped going regularly ("lazziness" per patient), and now going twice weekly.    Labs:  07/2013 - TC 174, TG 104, LDL 93, HDL 61 (Crestor 5 mg qod, slo-niacin 2 g/d, fish oil 2 g/d) 05/2013 - TC 183, TG 93, HDL 66, LDL 99, BMET normal, ALT 90, AST 72 (Crestor 5 mg qod, Fenofibrate 160 mg qd, Niacin 2 g qhs; gained weight over past few months) 02/2012 - ALT 56, AST 47 (Crestor 5 mg qod, Fenofibrate 160 mg qd, Niacin 2 g qhs)  Current Outpatient  Prescriptions  Medication Sig Dispense Refill  . aspirin 81 MG tablet Take 81 mg by mouth daily.        . cholecalciferol (VITAMIN D) 1000 UNITS tablet Take 2,000 Units by mouth daily.        . metoprolol tartrate (LOPRESSOR) 25 MG tablet Take 1 tablet (25 mg total) by mouth 2 (two) times daily.  180 tablet  3  . niacin (NIASPAN) 1000 MG CR tablet Take 1 tablet (1,000 mg total) by mouth at bedtime.  30 tablet  5  . Omega-3 Fatty Acids (FISH OIL) 1000 MG CPDR Take 2,000 mg by mouth daily.      Marland Kitchen. omeprazole (PRILOSEC) 40 MG capsule TAKE 1 CAPSULE (40 MG TOTAL) BY MOUTH DAILY.  30 capsule  5  . rosuvastatin (CRESTOR) 5 MG tablet Take 5 mg by mouth. Take one tablet four days a week       No current facility-administered medications for this visit.   Allergies  Allergen Reactions  . Crestor [Rosuvastatin]     Possible  memory issue when taking Crestor daily at high dose  . Hctz [Hydrochlorothiazide]    Family History  Problem Relation Age of Onset  . Hypertension

## 2013-08-26 ENCOUNTER — Other Ambulatory Visit: Payer: Self-pay | Admitting: Cardiovascular Disease

## 2013-09-10 ENCOUNTER — Other Ambulatory Visit: Payer: Self-pay | Admitting: Cardiovascular Disease

## 2013-09-12 ENCOUNTER — Other Ambulatory Visit (INDEPENDENT_AMBULATORY_CARE_PROVIDER_SITE_OTHER): Payer: 59

## 2013-09-12 DIAGNOSIS — Z79899 Other long term (current) drug therapy: Secondary | ICD-10-CM

## 2013-09-12 DIAGNOSIS — E785 Hyperlipidemia, unspecified: Secondary | ICD-10-CM

## 2013-09-12 LAB — HEPATIC FUNCTION PANEL
ALBUMIN: 4.2 g/dL (ref 3.5–5.2)
ALT: 61 U/L — ABNORMAL HIGH (ref 0–53)
AST: 43 U/L — ABNORMAL HIGH (ref 0–37)
Alkaline Phosphatase: 150 U/L — ABNORMAL HIGH (ref 39–117)
Bilirubin, Direct: 0.1 mg/dL (ref 0.0–0.3)
TOTAL PROTEIN: 6.7 g/dL (ref 6.0–8.3)
Total Bilirubin: 0.7 mg/dL (ref 0.2–1.2)

## 2013-09-13 ENCOUNTER — Encounter: Payer: Self-pay | Admitting: Nurse Practitioner

## 2013-11-06 ENCOUNTER — Other Ambulatory Visit: Payer: 59

## 2013-11-15 ENCOUNTER — Other Ambulatory Visit: Payer: Self-pay | Admitting: Cardiovascular Disease

## 2013-12-04 ENCOUNTER — Other Ambulatory Visit (INDEPENDENT_AMBULATORY_CARE_PROVIDER_SITE_OTHER): Payer: 59

## 2013-12-04 DIAGNOSIS — E785 Hyperlipidemia, unspecified: Secondary | ICD-10-CM

## 2013-12-04 DIAGNOSIS — Z79899 Other long term (current) drug therapy: Secondary | ICD-10-CM

## 2013-12-04 LAB — LIPID PANEL
Cholesterol: 169 mg/dL (ref 0–200)
HDL: 48.9 mg/dL (ref >39.00)
LDL Cholesterol: 92 mg/dL (ref 0–99)
NONHDL: 120.1
Total CHOL/HDL Ratio: 3
Triglycerides: 141 mg/dL (ref 0.0–149.0)
VLDL: 28.2 mg/dL (ref 0.0–40.0)

## 2013-12-04 LAB — HEPATIC FUNCTION PANEL
ALT: 77 U/L — ABNORMAL HIGH (ref 0–53)
AST: 51 U/L — ABNORMAL HIGH (ref 0–37)
Albumin: 4.3 g/dL (ref 3.5–5.2)
Alkaline Phosphatase: 155 U/L — ABNORMAL HIGH (ref 39–117)
BILIRUBIN DIRECT: 0.1 mg/dL (ref 0.0–0.3)
BILIRUBIN TOTAL: 0.8 mg/dL (ref 0.2–1.2)
Total Protein: 7.2 g/dL (ref 6.0–8.3)

## 2013-12-06 ENCOUNTER — Ambulatory Visit (INDEPENDENT_AMBULATORY_CARE_PROVIDER_SITE_OTHER): Payer: 59 | Admitting: Pharmacist

## 2013-12-06 VITALS — Wt 209.0 lb

## 2013-12-06 DIAGNOSIS — E785 Hyperlipidemia, unspecified: Secondary | ICD-10-CM

## 2013-12-06 DIAGNOSIS — Z79899 Other long term (current) drug therapy: Secondary | ICD-10-CM

## 2013-12-06 MED ORDER — FISH OIL 1000 MG PO CPDR: 2000.0000 mg | DELAYED_RELEASE_CAPSULE | Freq: Two times a day (BID) | ORAL | Status: DC

## 2013-12-06 NOTE — Patient Instructions (Signed)
1.  Stop niaspan when your current prescription runs out. 2.  Increase fish oil to 4 capsules per day at that time. 3.  Continue Crestor 5 mg every other day. 4.  Continue exercise efforts, and healthy diet. 5.  Recheck cholesterol and liver on 05/06/2014 (fasting labs), and see Riki RuskJeremy the next day 05/07/13 (8:30 am)

## 2013-12-06 NOTE — Progress Notes (Signed)
Louis Delgado is a pleasant 53 y.o. WM referred to lipid clinic by Dr. Elease Hashimoto given elevated LFTs and on lipid lowering therapy.  Louis Delgado's fenofibrate was stopped in the past as it was felt unnecessary and his lipid panel and LFTs remained stable.  In 07/2013, I changed his Slo-Niacin 2 g qhs to niaspan 1 g qhs in hopes of improving LFTs as Slo-niacin can be hepatotoxic, and his LFTs did improve slightly.  His LFTs remain slightly elevated today on Crestor 5 mg qod, fish oil 2 g/d, and NIaspan 1 g qhs.  His cholesterol panel remains controlled, however his TG are trending up.  Louis Delgado being treated for primary prevention.    Of note, he had a Hep C panel with PCP last month and it was negative.  A1C 5.4 with PCP.  He states that ~ 20 years he was put on lipid lowering meds at another practice but he ultimately stopped them.  He doesn't recall why, but he tells me he never had muscle aches with any lipid lowering meds. He took Crestor at higher doses everyday in the past, but he though it was causing some "memory fog" so this was stopped and ultimately reduced to 5 mg qod which he is tolerating well currently.    He admits that his weight has been trending up slowly over past 12 months as he hasn't been as active.  He does carry his weight in the abdomen, and he has an abdominal U/S done in 02/2007 which showed possible fatty liver.  In 05/2013 ALT 90 / AST 72,  07/2013 ALT 103 / AST 52, and now ALT 77 / AST 51 which is improvement.  Given his LDL and non-HDL are both controlled, and HDL well above 40 m/gdL,  I'm not certain niaspan is still an appropriate therapy.   He took fish oil years ago, but due to reflux he stopped. He is now on a better brand of fish oil, keeps it in refrigerator, and tolerates well.  LDL goal < 100 mg/dL preferably, non-HDL goal < 130 mg/dL Meds:  Crestor 5 mg qod (4 times a week per Louis Delgado), fish oil 2 g/d, Niaspan 1 g qhs Intolerant:  Daily higher dose Crestor (memory fog) Louis Delgado thinks  he may have taken other medications ~ 15-20 years ago when he was being treated at another practice, but he doesn't recall any other statins.  He is sure that he never developed muscle aches on meds in the past.  Social history:  Rarely drinks alcohol.  No tobacco use. Diet:  No change over past 3 months.  Breakfast - oatmeal or cereal in the mornings.  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  He does get some fried foods and fries periodically.  Dinner he eats a meat and vegetable from home.  Rarely drinks soda.  Periodically eats dessert, but not large servings of anything.  His worst meal is lunch.   Exercise:  Was working out a the [employer] 3-4 days per week, however ~ 4-6 months ago he stopped going regularly ("lazziness" per Louis Delgado), and now going twice weekly.    Labs:  11/2013- TC 169, TG 141, LDL 92, HDL 49, ALT 77, AST 51 (Crestor 5 mg qod, Niaspan 1 g qhs, fish oil 2 g/d) 07/2013 - TC 174, TG 104, LDL 93, HDL 61 (Crestor 5 mg qod, slo-niacin 2 g/d, fish oil 2 g/d) 05/2013 - TC 183, TG 93, HDL 66, LDL 99,  BMET normal, ALT 90, AST 72 (Crestor 5 mg qod, Fenofibrate 160 mg qd, Niacin 2 g qhs; gained weight over past few months) 02/2012 - ALT 56, AST 47 (Crestor 5 mg qod, Fenofibrate 160 mg qd, Niacin 2 g qhs)  Current Outpatient Prescriptions  Medication Sig Dispense Refill  . aspirin 81 MG tablet Take 81 mg by mouth daily.        . cholecalciferol (VITAMIN D) 1000 UNITS tablet Take 2,000 Units by mouth daily.        . CRESTOR 5 MG tablet TAKE ONE TABLET EVERY OTHER DAY  30 tablet  1  . metoprolol tartrate (LOPRESSOR) 25 MG tablet Take 1 tablet by mouth  twice a day  180 tablet  1  . niacin (NIASPAN) 1000 MG CR tablet Take 1 tablet (1,000 mg total) by mouth at bedtime.  30 tablet  5  . Omega-3 Fatty Acids (FISH OIL) 1000 MG CPDR Take 2,000 mg by mouth daily.      Marland Kitchen. omeprazole (PRILOSEC) 40 MG capsule TAKE 1 CAPSULE (40 MG TOTAL) BY MOUTH DAILY.  30 capsule  5   . rosuvastatin (CRESTOR) 5 MG tablet Take 5 mg by mouth. Take one tablet four days a week       No current facility-administered medications for this visit.   Allergies  Allergen Reactions  . Crestor [Rosuvastatin]     Possible memory issue when taking Crestor daily at high dose  . Hctz [Hydrochlorothiazide]    Family History  Problem Relation Age of Onset  . Hypertension

## 2013-12-06 NOTE — Assessment & Plan Note (Signed)
Will d/c niaspan today given LDL, non-HDL, and HDL all at goal in patient being treated for primary prevention.  Given TG trending up slightly, will increase fish oil to 4 g/d since stopping niaspan.  Continue crestor 5 mg qod as tolerating well and LDL < 100.  Will continue to watch LFTs, however fatty liver possible source.  LFTs may improve some once off niacin.  Recheck lipid / liver in 4 months, and see me the following day.  Patient will try to increase his days at the gym as hoping to get the extra weight off.

## 2014-02-26 ENCOUNTER — Other Ambulatory Visit: Payer: Self-pay | Admitting: Cardiovascular Disease

## 2014-04-19 HISTORY — PX: RETINAL DETACHMENT SURGERY: SHX105

## 2014-05-06 ENCOUNTER — Other Ambulatory Visit (INDEPENDENT_AMBULATORY_CARE_PROVIDER_SITE_OTHER): Payer: 59 | Admitting: *Deleted

## 2014-05-06 DIAGNOSIS — Z79899 Other long term (current) drug therapy: Secondary | ICD-10-CM

## 2014-05-06 DIAGNOSIS — E785 Hyperlipidemia, unspecified: Secondary | ICD-10-CM

## 2014-05-06 LAB — LIPID PANEL
CHOL/HDL RATIO: 4
Cholesterol: 149 mg/dL (ref 0–200)
HDL: 41.9 mg/dL (ref >39.00)
LDL Cholesterol: 72 mg/dL (ref 0–99)
NonHDL: 107.1
TRIGLYCERIDES: 176 mg/dL — AB (ref 0.0–149.0)
VLDL: 35.2 mg/dL (ref 0.0–40.0)

## 2014-05-06 LAB — HEPATIC FUNCTION PANEL
ALBUMIN: 4.1 g/dL (ref 3.5–5.2)
ALT: 61 U/L — AB (ref 0–53)
AST: 36 U/L (ref 0–37)
Alkaline Phosphatase: 128 U/L — ABNORMAL HIGH (ref 39–117)
BILIRUBIN DIRECT: 0.1 mg/dL (ref 0.0–0.3)
TOTAL PROTEIN: 6.5 g/dL (ref 6.0–8.3)
Total Bilirubin: 0.5 mg/dL (ref 0.2–1.2)

## 2014-05-06 NOTE — Addendum Note (Signed)
Addended by: Tonita PhoenixBOWDEN, Markiya Keefe K on: 05/06/2014 08:11 AM   Modules accepted: Orders

## 2014-05-07 ENCOUNTER — Encounter: Payer: Self-pay | Admitting: Pharmacist

## 2014-05-07 ENCOUNTER — Ambulatory Visit (INDEPENDENT_AMBULATORY_CARE_PROVIDER_SITE_OTHER): Payer: 59 | Admitting: Pharmacist

## 2014-05-07 VITALS — Wt 208.5 lb

## 2014-05-07 DIAGNOSIS — E785 Hyperlipidemia, unspecified: Secondary | ICD-10-CM | POA: Diagnosis not present

## 2014-05-07 NOTE — Patient Instructions (Signed)
It was nice to meet you today.  Continue current cholesterol lowering medicines Your cholesterol levels and liver function tests look great Work on increasing weekly exercise and decreasing red meats in your diet Please continue following up with your primary care doctor and cardiologist

## 2014-05-07 NOTE — Progress Notes (Signed)
S/O: [name] is a pleasant 53yo WM here for 39mo f/u in lipid clinic - originally referred by Dr. Elease Delgado given elevated LFTs and on lipid lowering therapy.  Patient's fenofibrate was stopped in the past as it was felt unnecessary and his lipid panel and LFTs remained stable.  In 07/2013, his Slo-Niacin 2g QHS was switched to niaspan 1g QHS in hopes of improving LFTs as Slo-niacin can be hepatotoxic, and his LFTs did improve slightly.  His LFTs continue to improve - currently on Crestor 5mg  qod and fish oil 4g daily.  His cholesterol panel remains controlled, however his TG are still trending up.  Patient being treated for primary prevention.    Of note, he had a Hep C panel with PCP in 10/2013 and it was negative.  He states that ~ 20 years he was put on lipid lowering meds at another practice but he ultimately stopped them.  He doesn't recall why, but he reports never had muscle aches with any lipid lowering meds. He took Crestor at higher doses everyday in the past, but he though it was causing some "memory fog" so this was stopped and ultimately reduced to 5mg  qod which he continues to tolerate well.    He admits decreased exercise over the past few months, but his weight has remained relatively stable.  He does carry his weight in the abdomen, and he has an abdominal U/S done in 02/2007 which showed possible fatty liver.  In 05/2013 ALT 90 / AST 72,  07/2013 ALT 103 / AST 52, 11/2013 ALT 77 / AST 51, and 04/2014 ALT 61 / AST 36 which is improvement.  LDL goal < 100 mg/dL preferably, non-HDL goal < 130 mg/dL Meds:  Crestor 5 mg qod (4 times a week per patient), fish oil 4 g/d Intolerant:  Daily higher dose Crestor (memory fog) Patient thinks he may have taken other medications ~ 15-20 years ago when he was being treated at another practice, but he doesn't recall any other statins.  He is sure that he never developed muscle aches on meds in the past.  Social history:  Rarely drinks alcohol.  No tobacco  use. Diet:  No change over past 3 months.  Breakfast - oatmeal (switched from instant to steel cut) or cereal in the mornings.  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  He does get some fried foods and fries periodically.  Dinner he eats a meat and vegetable from home.   Reports red meats 3-4x/wk. Rarely drinks soda.  Periodically eats dessert, but not large servings of anything.  His worst meal is lunch.   Exercise:  Previously brisk walking for 30-4240min 3-4x/wk, currently only 2x/wk  Labs:  1/216: TC 149, TG 176, LDL 72, HDL 42, ALT 61, AST 36 (Crestor 5mg  qod, fish oil 4g/d) 11/2013- TC 169, TG 141, LDL 92, HDL 49, ALT 77, AST 51 (Crestor 5 mg qod, Niaspan 1 g qhs, fish oil 2 g/d) 07/2013 - TC 174, TG 104, LDL 93, HDL 61 (Crestor 5 mg qod, slo-niacin 2 g/d, fish oil 2 g/d) 05/2013 - TC 183, TG 93, HDL 66, LDL 99, BMET normal, ALT 90, AST 72 (Crestor 5 mg qod, Fenofibrate 160 mg qd, Niacin 2 g qhs; gained weight over past few months) 02/2012 - ALT 56, AST 47 (Crestor 5 mg qod, Fenofibrate 160 mg qd, Niacin 2 g qhs)  Outpatient Encounter Prescriptions as of 05/07/2014  Medication Sig  . aspirin  81 MG tablet Take 81 mg by mouth daily.    . cholecalciferol (VITAMIN D) 1000 UNITS tablet Take 2,000 Units by mouth daily.    . CRESTOR 5 MG tablet TAKE ONE TABLET EVERY OTHER DAY  . metoprolol tartrate (LOPRESSOR) 25 MG tablet Take 1 tablet by mouth  twice a day  . Omega-3 Fatty Acids (FISH OIL) 1000 MG CPDR Take 2,000 mg by mouth 2 (two) times daily.  Marland Kitchen omeprazole (PRILOSEC) 40 MG capsule TAKE 1 CAPSULE (40 MG TOTAL) BY MOUTH DAILY.    Allergies  Allergen Reactions  . Crestor [Rosuvastatin]     Possible memory issue when taking Crestor daily at high dose  . Hctz [Hydrochlorothiazide]    Family History  Problem Relation Age of Onset  . Hypertension      A/P: [name] lipids continue to improve and is currently well below LDL goal of < 100 and non-HDL goal of <  130.  His TG continue to trend up despite increase in fish oil, but may be related to DC of Niaspan.  HDL also trending down which may be explained, at least in part, by DC of Niaspan and decreased exercise over the past few months.  LFTs continue to improve and have almost completely resolved w/ DC of Niaspan and fenofibrate.  Continue Crestor  qod and fish oil 4g daily Work on increasing weekly exercise and decreasing red meats No need to F/U in lipid clinic at this time - continue care with PCP and cardiologist  Waynette Buttery, PharmD Clinical Pharmacy Resident

## 2014-05-22 ENCOUNTER — Other Ambulatory Visit: Payer: Self-pay | Admitting: Cardiovascular Disease

## 2014-06-03 NOTE — Progress Notes (Signed)
Agree with above 

## 2014-06-14 ENCOUNTER — Other Ambulatory Visit: Payer: Self-pay

## 2014-06-14 MED ORDER — ROSUVASTATIN CALCIUM 5 MG PO TABS: ORAL_TABLET | ORAL | Status: DC

## 2014-08-29 ENCOUNTER — Other Ambulatory Visit: Payer: Self-pay | Admitting: Cardiovascular Disease

## 2014-09-07 ENCOUNTER — Other Ambulatory Visit: Payer: Self-pay | Admitting: Cardiovascular Disease

## 2014-09-23 ENCOUNTER — Other Ambulatory Visit: Payer: Self-pay | Admitting: Cardiovascular Disease

## 2014-10-14 ENCOUNTER — Telehealth: Payer: Self-pay | Admitting: Cardiovascular Disease

## 2014-10-14 DIAGNOSIS — E785 Hyperlipidemia, unspecified: Secondary | ICD-10-CM

## 2014-10-14 NOTE — Telephone Encounter (Signed)
New problem   Pt want to know if he need labs before his 7.1.16 appt w/Dr Elease HashimotoNahser. Please advise pt.

## 2014-10-14 NOTE — Telephone Encounter (Signed)
Spoke with patient and scheduled fasting lab appointment for Wednesday 6/29.  Patient verbalized understanding and agreement and orders in epic.

## 2014-10-16 ENCOUNTER — Other Ambulatory Visit (INDEPENDENT_AMBULATORY_CARE_PROVIDER_SITE_OTHER): Payer: 59 | Admitting: *Deleted

## 2014-10-16 DIAGNOSIS — E785 Hyperlipidemia, unspecified: Secondary | ICD-10-CM

## 2014-10-16 LAB — BASIC METABOLIC PANEL
BUN: 19 mg/dL (ref 6–23)
CHLORIDE: 103 meq/L (ref 96–112)
CO2: 29 mEq/L (ref 19–32)
Calcium: 9.6 mg/dL (ref 8.4–10.5)
Creatinine, Ser: 1.12 mg/dL (ref 0.40–1.50)
GFR: 72.51 mL/min (ref >60.00)
Glucose, Bld: 96 mg/dL (ref 70–99)
POTASSIUM: 4.2 meq/L (ref 3.5–5.1)
Sodium: 139 mEq/L (ref 135–145)

## 2014-10-16 LAB — HEPATIC FUNCTION PANEL
ALT: 71 U/L — ABNORMAL HIGH (ref 0–53)
AST: 42 U/L — ABNORMAL HIGH (ref 0–37)
Albumin: 4.6 g/dL (ref 3.5–5.2)
Alkaline Phosphatase: 120 U/L — ABNORMAL HIGH (ref 39–117)
BILIRUBIN DIRECT: 0.1 mg/dL (ref 0.0–0.3)
Total Bilirubin: 0.8 mg/dL (ref 0.2–1.2)
Total Protein: 7.1 g/dL (ref 6.0–8.3)

## 2014-10-16 LAB — LIPID PANEL
CHOL/HDL RATIO: 4
Cholesterol: 171 mg/dL (ref 0–200)
HDL: 41.5 mg/dL (ref >39.00)
LDL Cholesterol: 100 mg/dL — ABNORMAL HIGH (ref 0–99)
NONHDL: 129.5
Triglycerides: 146 mg/dL (ref 0.0–149.0)
VLDL: 29.2 mg/dL (ref 0.0–40.0)

## 2014-10-18 ENCOUNTER — Encounter: Payer: Self-pay | Admitting: Cardiovascular Disease

## 2014-10-18 ENCOUNTER — Ambulatory Visit (INDEPENDENT_AMBULATORY_CARE_PROVIDER_SITE_OTHER): Payer: 59 | Admitting: Cardiovascular Disease

## 2014-10-18 VITALS — BP 136/78 | HR 54 | Ht 71.0 in | Wt 214.0 lb

## 2014-10-18 DIAGNOSIS — I10 Essential (primary) hypertension: Secondary | ICD-10-CM

## 2014-10-18 DIAGNOSIS — E785 Hyperlipidemia, unspecified: Secondary | ICD-10-CM | POA: Diagnosis not present

## 2014-10-18 DIAGNOSIS — I351 Nonrheumatic aortic (valve) insufficiency: Secondary | ICD-10-CM

## 2014-10-18 MED ORDER — ROSUVASTATIN CALCIUM 5 MG PO TABS: 5.0000 mg | ORAL_TABLET | Freq: Every day | ORAL | Status: DC

## 2014-10-18 MED ORDER — METOPROLOL TARTRATE 25 MG PO TABS: 25.0000 mg | ORAL_TABLET | Freq: Two times a day (BID) | ORAL | Status: DC

## 2014-10-18 NOTE — Patient Instructions (Signed)
Medication Instructions:  Your physician recommends that you continue on your current medications as directed. Please refer to the Current Medication list given to you today.   Labwork: Your physician recommends that you return for lab work in: 6 months on the day of or a few days before your office visit with Dr. Elease HashimotoNahser.  You will need to FAST for this appointment - nothing to eat or drink after midnight the night before except water.    Testing/Procedures: None Ordered   Follow-Up: Your physician wants you to follow-up in: 1 year with Dr. Elease HashimotoNahser.  You will receive a reminder letter in the mail two months in advance. If you don't receive a letter, please call our office to schedule the follow-up appointment.

## 2014-10-18 NOTE — Progress Notes (Signed)
Louis Delgado Date of Birth  12/26/1960       [facility]    [place] [facility] [address]  [address], [address] [place], [place]  [address]   [place], [place]  [id number] [phone]     [phone]   Fax  [phone]    Fax [phone]  Problem List: 1. Hypertension 2. Hyperlipidemia 3.  Aortic insufficiency   History of Present Illness: Louis Delgado is a 54 y.o. gentleman with a hx of a  aortic insufficiency, hypertension, and hyperlipidemia. He's done well from a cardiac standpoint. He's not had any episodes of chest pain or shortness of breath. He has not been exercising quite as much as he would like but is looking forward to getting back into his exercise regimen.  He has not been getting as much exercise that he would like and he has gained some weight.   He has gained about 5 pounds.   He is otherwise feeling well.  Feb. 27, 2014 Louis Delgado is doing well from a cardiac standpoint.  He has been having some tingling and numbness in his fingers and toes.    It typically resolved if he moves his hands around.  No CP or dyspnea.  November 27, 2012:  Louis Delgado is doing well. He's having any episodes of chest pain or shortness breath. He has bicuspid aortic valve/aortic insufficiency that we have been following. He's not having any symptoms related to the aortic insufficiency. His blood pressure and heart rate at been well-controlled.  Feb. 11, 2015:  Louis Delgado is doing OK.   His TEE showed moderate AI but a 3 leaflet aortic valve.    Getting out and exercising.   His recent labs showed good lipid levels.   October 18, 2014:  Louis Delgado is seen back today for follow up of his aortic insufficiency . Has been seen in the lipid clinic - lipids have been ok Has had a detached retina and has not been exercising as much this past spring. No CP , no dyspnea    Current Outpatient Prescriptions on File Prior to Visit  Medication Sig Dispense Refill  . aspirin 81 MG tablet Take 81 mg by  mouth daily.      . cholecalciferol (VITAMIN D) 1000 UNITS tablet Take 2,000 Units by mouth daily.      . CRESTOR 5 MG tablet TAKE 1 TABLET EVERY OTHER DAY NEEDS APPOINTMENT FOR FUTURE REFILLS 15 tablet 1  . metoprolol tartrate (LOPRESSOR) 25 MG tablet Take 1 tablet by mouth  twice a day 180 tablet 0  . Omega-3 Fatty Acids (FISH OIL) 1000 MG CPDR Take 2,000 mg by mouth 2 (two) times daily.    Marland Kitchen. omeprazole (PRILOSEC) 40 MG capsule TAKE 1 CAPSULE (40 MG TOTAL) BY MOUTH DAILY. 30 capsule 0   No current facility-administered medications on file prior to visit.    Allergies  Allergen Reactions  . Crestor [Rosuvastatin]     Possible memory issue when taking Crestor daily at high dose  . Hctz [Hydrochlorothiazide]     Past Medical History  Diagnosis Date  . Hypertension   . Hyperlipidemia   . Bicuspid aortic valve     moderate aortic isufficiency  . Hypercholesterolemia   . Gastroesophageal reflux disease   . Hiatal hernia   . Kidney stones   . Left ventricular hypertrophy     mild  . Chest pain     Past Surgical History  Procedure Laterality Date  .  US echocardiography  06-23-2009    Est EF 55-60%  . Cardiovascular stress test  11-23-2005    EF 55%  . Cataract extraction Bilateral November & December 2013  . Tee without cardioversion N/A 01/11/2013    Procedure: TRANSESOPHAGEAL ECHOCARDIOGRAM (TEE);  Surgeon: Vesta Mixer, MD;  Location: [facility] END[facility]COPY;  Service: Cardiovascular;  Laterality: N/A;    History  Smoking status  . Never Smoker   Smokeless tobacco  . Never Used    History  Alcohol Use No    Family History  Problem Relation Age of Onset  . Hypertension    . Dementia Father     Reviw of Systems:  Reviewed in the HPI.  All other systems are negative.  Physical Exam: Blood pressure 136/78, pulse 54, height  (1.803 m), weight 97.07 kg (214 lb). General: Well developed, well nourished, in no acute distress.  Head: Normocephalic, atraumatic, sclera  non-icteric, mucus membranes are moist,   Neck: Supple. Carotids are 2 + without bruits. No JVD  Lungs: Clear bilaterally to auscultation.  Heart: regular rate.  normal  S1 S2. He has a soft systolic murmur.  Abdomen: Soft, non-tender, non-distended with normal bowel sounds. No hepatomegaly. No rebound/guarding. No masses.  Msk:  Strength and tone are normal  Extremities: No clubbing or cyanosis. No edema.  Distal pedal pulses are 2+ and equal bilaterally.  Neuro: Alert and oriented X 3. Moves all extremities spontaneously.  Psych:  Responds to questions appropriately with a normal affect.  ECG: October 18, 2014:  sinus brady at 54.    Assessment / Plan:   1. Hypertension - BP is well controlled.   2. Hyperlipidemia - lipids are ok , liver enzymes are ok   3.  Aortic insufficiency - the TEE shows a 3 leaflet valve     Calixto Pavel, Deloris Ping, MD  10/18/2014 9:10 AM    [facility] [address] [place],  [address] [place], [place]  [address] Pager [phone] Phone: [phone]; Fax: [phone]   [facility]  [address] [place], [place]  [address] [phone]    Fax [phone]

## 2014-10-22 ENCOUNTER — Other Ambulatory Visit: Payer: Self-pay

## 2014-10-22 MED ORDER — ROSUVASTATIN CALCIUM 5 MG PO TABS: ORAL_TABLET | ORAL | Status: DC

## 2014-11-01 ENCOUNTER — Other Ambulatory Visit: Payer: Self-pay | Admitting: Cardiovascular Disease

## 2014-11-02 ENCOUNTER — Other Ambulatory Visit: Payer: Self-pay | Admitting: Cardiovascular Disease

## 2015-08-05 ENCOUNTER — Encounter: Payer: Self-pay | Admitting: Cardiovascular Disease

## 2015-08-05 ENCOUNTER — Ambulatory Visit (INDEPENDENT_AMBULATORY_CARE_PROVIDER_SITE_OTHER): Payer: BLUE CROSS/BLUE SHIELD | Admitting: Cardiovascular Disease

## 2015-08-05 VITALS — BP 130/76 | HR 60 | Ht 71.0 in | Wt 213.4 lb

## 2015-08-05 DIAGNOSIS — R0683 Snoring: Secondary | ICD-10-CM | POA: Diagnosis not present

## 2015-08-05 DIAGNOSIS — I351 Nonrheumatic aortic (valve) insufficiency: Secondary | ICD-10-CM

## 2015-08-05 DIAGNOSIS — E785 Hyperlipidemia, unspecified: Secondary | ICD-10-CM | POA: Diagnosis not present

## 2015-08-05 DIAGNOSIS — I10 Essential (primary) hypertension: Secondary | ICD-10-CM | POA: Diagnosis not present

## 2015-08-05 LAB — COMPREHENSIVE METABOLIC PANEL
ALBUMIN: 4.5 g/dL (ref 3.6–5.1)
ALT: 48 U/L — ABNORMAL HIGH (ref 9–46)
AST: 33 U/L (ref 10–35)
Alkaline Phosphatase: 101 U/L (ref 40–115)
BILIRUBIN TOTAL: 0.7 mg/dL (ref 0.2–1.2)
BUN: 17 mg/dL (ref 7–25)
CHLORIDE: 103 mmol/L (ref 98–110)
CO2: 28 mmol/L (ref 20–31)
Calcium: 9.5 mg/dL (ref 8.6–10.3)
Creat: 0.97 mg/dL (ref 0.70–1.33)
GLUCOSE: 88 mg/dL (ref 65–99)
POTASSIUM: 4.9 mmol/L (ref 3.5–5.3)
Sodium: 141 mmol/L (ref 135–146)
Total Protein: 6.6 g/dL (ref 6.1–8.1)

## 2015-08-05 LAB — LIPID PANEL
CHOLESTEROL: 166 mg/dL (ref 125–200)
HDL: 47 mg/dL (ref >=40)
LDL Cholesterol: 95 mg/dL (ref <130)
TRIGLYCERIDES: 120 mg/dL (ref <150)
Total CHOL/HDL Ratio: 3.5 Ratio (ref <=5.0)
VLDL: 24 mg/dL (ref <30)

## 2015-08-05 NOTE — Progress Notes (Signed)
Clement Husbandsaymond Happe Date of Birth  07/21/1960       [facility]    [place] [facility] [address]  [address] [place], [place]  [address]   [place]  [id number] [phone]     [phone]   Fax  [phone]    Fax [phone]  Problem List: 1. Hypertension 2. Hyperlipidemia 3.  Aortic insufficiency   History of Present Illness: Ty is a 55 y.o. gentleman with a hx of a  aortic insufficiency, hypertension, and hyperlipidemia. He's done well from a cardiac standpoint. He's not had any episodes of chest pain or shortness of breath. He has not been exercising quite as much as he would like but is looking forward to getting back into his exercise regimen.  He has not been getting as much exercise that he would like and he has gained some weight.   He has gained about 5 pounds.   He is otherwise feeling well.  Feb. 27, 2014 Ty is doing well from a cardiac standpoint.  He has been having some tingling and numbness in his fingers and toes.    It typically resolved if he moves his hands around.  No CP or dyspnea.  November 27, 2012:  Ty is doing well. He's having any episodes of chest pain or shortness breath. He has bicuspid aortic valve/aortic insufficiency that we have been following. He's not having any symptoms related to the aortic insufficiency. His blood pressure and heart rate at been well-controlled.  Feb. 11, 2015:  Ty is doing OK.   His TEE showed moderate AI but a 3 leaflet aortic valve.    Getting out and exercising.   His recent labs showed good lipid levels.   October 18, 2014:  Ty is seen back today for follow up of his aortic insufficiency . Has been seen in the lipid clinic - lipids have been ok Has had a detached retina and has not been exercising as much this past spring. No CP , no dyspnea  August 05, 2015  Ty is seen back today  Doing well Had an episode of blepharitis  Thinks that he may need another sleep  study. Snores loudly , Had a sleep study 7-8 years ago - had borderline OSA at that time.  More fatigued recently.   Has an AM headache frequently  Exercising several times a week .     Current Outpatient Prescriptions on File Prior to Visit  Medication Sig Dispense Refill  . aspirin 81 MG tablet Take 81 mg by mouth daily.      . cholecalciferol (VITAMIN D) 1000 UNITS tablet Take 2,000 Units by mouth daily.      . metoprolol tartrate (LOPRESSOR) 25 MG tablet Take 1 tablet (25 mg total) by mouth 2 (two) times daily. 180 tablet 3  . Omega-3 Fatty Acids (FISH OIL) 1000 MG CPDR Take 2,000 mg by mouth 2 (two) times daily.    Marland Kitchen. omeprazole (PRILOSEC) 40 MG capsule TAKE 1 CAPSULE (40 MG TOTAL) BY MOUTH DAILY. 30 capsule 11  . rosuvastatin (CRESTOR) 5 MG tablet PATIENT TAKES CRESTOR 5MG  QOD, OR 4 TIMES WEEKLY. LAST RX SENT SAID '1PO DAILY' PLEASE CORRECT IN YOUR COMPUTER. THANKS 46 tablet 2   No current facility-administered medications on file prior to visit.    Allergies  Allergen Reactions  . Crestor [Rosuvastatin]     Possible memory issue when taking Crestor daily at high dose  .  Hctz [Hydrochlorothiazide]     Past Medical History  Diagnosis Date  . Hypertension   . Hyperlipidemia   . Bicuspid aortic valve     moderate aortic isufficiency  . Hypercholesterolemia   . Gastroesophageal reflux disease   . Hiatal hernia   . Kidney stones   . Left ventricular hypertrophy     mild  . Chest pain     Past Surgical History  Procedure Laterality Date  . US echocardiography  06-23-2009    Est EF 55-60%  . Cardiovascular stress test  11-23-2005    EF 55%  . Cataract extraction Bilateral November & December 2013  . Tee without cardioversion N/A 01/11/2013    Procedure: TRANSESOPHAGEAL ECHOCARDIOGRAM (TEE);  Surgeon: Vesta Mixer, MD;  Location: [facility] ENDOSCOPY;  Service: Cardiovascular;  Laterality: N/A;    History  Smoking status  . Never Smoker   Smokeless tobacco  . Never Used     History  Alcohol Use No    Family History  Problem Relation Age of Onset  . Hypertension    . Dementia Father     Reviw of Systems:  Reviewed in the HPI.  All other systems are negative.  Physical Exam: Blood pressure 130/76, pulse 60, height  (1.803 m), weight 213 lb 6.4 oz (96.798 kg). General: Well developed, well nourished, in no acute distress.  Head: Normocephalic, atraumatic, sclera non-icteric, mucus membranes are moist,   Neck: Supple. Carotids are 2 + without bruits. No JVD  Lungs: Clear bilaterally to auscultation.  Heart: regular rate.  normal  S1 S2. He has a soft systolic murmur.  Abdomen: Soft, non-tender, non-distended with normal bowel sounds. No hepatomegaly. No rebound/guarding. No masses.  Msk:  Strength and tone are normal  Extremities: No clubbing or cyanosis. No edema.  Distal pedal pulses are 2+ and equal bilaterally.  Neuro: Alert and oriented X 3. Moves all extremities spontaneously.  Psych:  Responds to questions appropriately with a normal affect.  ECG:  Assessment / Plan:   1. Hypertension - BP is well controlled.  Continue meds  2. Hyperlipidemia - lipids are ok , liver enzymes are ok .  Check labs today    3.  Aortic insufficiency - the TEE shows a 3 leaflet valve ,  Murmur sounds stable  4. Possible OSA:   He's having some AM headaches.   Had borderline OSA at previous sleep study. Will repeat the sleep study .   Will see him in 1 year.    Nahser, Deloris Ping, MD  08/05/2015 9:16 AM    [facility] [address] [place],  [address] [place], [place]  [address] Pager [phone] Phone: [phone]; Fax: [phone]   [facility]  [address] [place], [place]  [address] [phone]    Fax [phone]

## 2015-08-05 NOTE — Patient Instructions (Addendum)
Medication Instructions:  Your physician recommends that you continue on your current medications as directed. Please refer to the Current Medication list given to you today.   Labwork: TODAY - cholesterol, complete metabolic panel   Testing/Procedures: Your physician has recommended that you have a sleep study. This test records several body functions during sleep, including: brain activity, eye movement, oxygen and carbon dioxide blood levels, heart rate and rhythm, breathing rate and rhythm, the flow of air through your mouth and nose, snoring, body muscle movements, and chest and belly movement.    Follow-Up: Your physician wants you to follow-up in: 1 year with Dr. Elease HashimotoNahser.  You will receive a reminder letter in the mail two months in advance. If you don't receive a letter, please call our office to schedule the follow-up appointment.   If you need a refill on your cardiac medications before your next appointment, please call your pharmacy.   Thank you for choosing [facility]! Eligha BridegroomMichelle Swinyer, RN [phone]

## 2015-09-16 ENCOUNTER — Telehealth: Payer: Self-pay | Admitting: Cardiovascular Disease

## 2015-09-16 NOTE — Telephone Encounter (Signed)
Patient will be calling the sleep lab to reschedule his appointment.

## 2015-09-16 NOTE — Telephone Encounter (Signed)
Left message to call back  

## 2015-09-16 NOTE — Telephone Encounter (Signed)
New Messdage  Pt wanted to resch Sleep study. Please call back and discuss.

## 2015-09-29 ENCOUNTER — Ambulatory Visit (HOSPITAL_BASED_OUTPATIENT_CLINIC_OR_DEPARTMENT_OTHER): Payer: BLUE CROSS/BLUE SHIELD

## 2015-10-02 DIAGNOSIS — H40053 Ocular hypertension, bilateral: Secondary | ICD-10-CM | POA: Diagnosis not present

## 2015-10-16 DIAGNOSIS — H3322 Serous retinal detachment, left eye: Secondary | ICD-10-CM | POA: Diagnosis not present

## 2015-10-16 DIAGNOSIS — Z961 Presence of intraocular lens: Secondary | ICD-10-CM | POA: Diagnosis not present

## 2015-10-28 ENCOUNTER — Other Ambulatory Visit: Payer: Self-pay | Admitting: Cardiovascular Disease

## 2015-11-06 ENCOUNTER — Ambulatory Visit (HOSPITAL_BASED_OUTPATIENT_CLINIC_OR_DEPARTMENT_OTHER): Payer: BLUE CROSS/BLUE SHIELD | Attending: Cardiovascular Disease | Admitting: Cardiology

## 2015-11-06 VITALS — Ht 71.0 in | Wt 215.0 lb

## 2015-11-06 DIAGNOSIS — I493 Ventricular premature depolarization: Secondary | ICD-10-CM | POA: Diagnosis not present

## 2015-11-06 DIAGNOSIS — R5383 Other fatigue: Secondary | ICD-10-CM | POA: Diagnosis not present

## 2015-11-06 DIAGNOSIS — G4719 Other hypersomnia: Secondary | ICD-10-CM | POA: Diagnosis not present

## 2015-11-06 DIAGNOSIS — R0683 Snoring: Secondary | ICD-10-CM | POA: Diagnosis not present

## 2015-11-06 DIAGNOSIS — Z6830 Body mass index (BMI) 30.0-30.9, adult: Secondary | ICD-10-CM | POA: Insufficient documentation

## 2015-11-06 DIAGNOSIS — I351 Nonrheumatic aortic (valve) insufficiency: Secondary | ICD-10-CM

## 2015-11-06 DIAGNOSIS — I491 Atrial premature depolarization: Secondary | ICD-10-CM | POA: Diagnosis not present

## 2015-11-06 DIAGNOSIS — E669 Obesity, unspecified: Secondary | ICD-10-CM | POA: Insufficient documentation

## 2015-11-21 DIAGNOSIS — R0683 Snoring: Secondary | ICD-10-CM | POA: Insufficient documentation

## 2015-11-21 DIAGNOSIS — G4719 Other hypersomnia: Secondary | ICD-10-CM | POA: Insufficient documentation

## 2015-11-21 NOTE — Procedures (Signed)
   Patient Name: Louis Delgado, Louis Delgado Date: 11/06/2015 Gender: Male D.O.B: [birth year] Age (years): 55 Referring Provider: Kristeen Miss Height (inches): 71 Interpreting Physician: Armanda Magic MD, ABSM Weight (lbs): 215 RPSGT: Cherylann Parr BMI: 30 MRN: [id number] Neck Size: 17.50  CLINICAL INFORMATION Sleep Study Type: NPSG Indication for sleep study: Fatigue, Obesity, Snoring Epworth Sleepiness Score: 6  SLEEP STUDY TECHNIQUE As per the AASM Manual for the Scoring of Sleep and Associated Events v2.3 (April 2016) with a hypopnea requiring 4% desaturations. The channels recorded and monitored were frontal, central and occipital EEG, electrooculogram (EOG), submentalis EMG (chin), nasal and oral airflow, thoracic and abdominal wall motion, anterior tibialis EMG, snore microphone, electrocardiogram, and pulse oximetry.  MEDICATIONS Patient's medications include: Reviewed in the chart. Medications self-administered by patient during sleep study : No sleep medicine administered.  SLEEP ARCHITECTURE The study was initiated at 10:43:29 PM and ended at 4:57:23 AM. Sleep onset time was 1.5 minutes and the sleep efficiency was 91.2%. The total sleep time was 340.9 minutes. Stage REM latency was 83.0 minutes. The patient spent 4.11% of the night in stage N1 sleep, 74.92% in stage N2 sleep, 2.20% in stage N3 and 18.77% in REM. Alpha intrusion was absent. Supine sleep was 24.24%.  RESPIRATORY PARAMETERS The overall apnea/hypopnea index (AHI) was 1.4 per hour. There were 8 total apneas, including 7 obstructive, 1 central and 0 mixed apneas. There were 0 hypopneas and 0 RERAs. The AHI during Stage REM sleep was 0.9 per hour. AHI while supine was 5.8 per hour. The mean oxygen saturation was 93.61%. The minimum SpO2 during sleep was 83.00%. Moderate snoring was noted during this study.  CARDIAC DATA The 2 lead EKG demonstrated sinus rhythm. The mean heart rate was 64.53 beats per  minute. Other EKG findings include: PVCs and PACs  LEG MOVEMENT DATA The total PLMS were 1 with a resulting PLMS index of 0.18. Associated arousal with leg movement index was 0.0 .  IMPRESSIONS - No significant obstructive sleep apnea occurred during this study (AHI = 1.4/h). - No significant central sleep apnea occurred during this study (CAI = 0.2/h). - Moderate oxygen desaturation was noted during this study (Min O2 = 83.00%). - The patient snored with Moderate snoring volume. - PVCs and PACs were noted during this study. - Clinically significant periodic limb movements did not occur during sleep. No significant associated arousals.  DIAGNOSIS - Excessive Daytime Sleepiness - Snoring  RECOMMENDATIONS - Avoid alcohol, sedatives and other CNS depressants that may results in sleep apnea and disrupt normal sleep architecture. - Sleep hygiene should be reviewed to assess factors that may improve sleep quality. - Weight management and regular exercise should be initiated or continued if appropriate.  Armanda Magic Diplomate, American Board of Sleep Medicine  ELECTRONICALLY SIGNED ON:  11/21/2015, 2:18 PM [facility] PH: [phone]   FX: [phone] ACCREDITED BY THE AMERICAN ACADEMY OF SLEEP MEDICINE

## 2015-11-25 ENCOUNTER — Other Ambulatory Visit: Payer: Self-pay | Admitting: Cardiovascular Disease

## 2015-11-25 NOTE — Progress Notes (Signed)
Let patient know results of sleep study. Patient verbalized understanding. All of patients questions were answered.

## 2015-12-15 DIAGNOSIS — J329 Chronic sinusitis, unspecified: Secondary | ICD-10-CM | POA: Diagnosis not present

## 2016-01-08 DIAGNOSIS — H3322 Serous retinal detachment, left eye: Secondary | ICD-10-CM | POA: Diagnosis not present

## 2016-01-08 DIAGNOSIS — Z961 Presence of intraocular lens: Secondary | ICD-10-CM | POA: Diagnosis not present

## 2016-01-08 DIAGNOSIS — H43813 Vitreous degeneration, bilateral: Secondary | ICD-10-CM | POA: Diagnosis not present

## 2016-06-10 ENCOUNTER — Other Ambulatory Visit: Payer: Self-pay | Admitting: *Deleted

## 2016-06-10 MED ORDER — METOPROLOL TARTRATE 25 MG PO TABS: 25.0000 mg | ORAL_TABLET | Freq: Two times a day (BID) | ORAL | 0 refills | Status: DC

## 2016-07-02 DIAGNOSIS — M238X2 Other internal derangements of left knee: Secondary | ICD-10-CM | POA: Diagnosis not present

## 2016-07-02 DIAGNOSIS — M25562 Pain in left knee: Secondary | ICD-10-CM | POA: Diagnosis not present

## 2016-07-23 ENCOUNTER — Telehealth: Payer: Self-pay | Admitting: Cardiovascular Disease

## 2016-07-23 ENCOUNTER — Other Ambulatory Visit: Payer: Self-pay | Admitting: Nurse Practitioner

## 2016-07-23 ENCOUNTER — Encounter: Payer: Self-pay | Admitting: Nurse Practitioner

## 2016-07-23 DIAGNOSIS — E782 Mixed hyperlipidemia: Secondary | ICD-10-CM

## 2016-07-23 NOTE — Telephone Encounter (Signed)
New Message   Pt called to reschedule appt, and states he needs labs a week before appt. No order in system right now.

## 2016-07-23 NOTE — Telephone Encounter (Signed)
Lab appointment scheduled for 5/15 and orders placed. I sent message to patient through MyChart

## 2016-08-12 ENCOUNTER — Ambulatory Visit: Payer: BLUE CROSS/BLUE SHIELD | Admitting: Cardiovascular Disease

## 2016-08-25 DIAGNOSIS — H01003 Unspecified blepharitis right eye, unspecified eyelid: Secondary | ICD-10-CM | POA: Diagnosis not present

## 2016-08-25 DIAGNOSIS — D2312 Other benign neoplasm of skin of left eyelid, including canthus: Secondary | ICD-10-CM | POA: Diagnosis not present

## 2016-08-25 DIAGNOSIS — L718 Other rosacea: Secondary | ICD-10-CM | POA: Diagnosis not present

## 2016-08-25 DIAGNOSIS — Z961 Presence of intraocular lens: Secondary | ICD-10-CM | POA: Diagnosis not present

## 2016-08-31 ENCOUNTER — Other Ambulatory Visit: Payer: BLUE CROSS/BLUE SHIELD

## 2016-09-01 ENCOUNTER — Other Ambulatory Visit: Payer: BLUE CROSS/BLUE SHIELD | Admitting: *Deleted

## 2016-09-01 DIAGNOSIS — E782 Mixed hyperlipidemia: Secondary | ICD-10-CM

## 2016-09-01 LAB — COMPREHENSIVE METABOLIC PANEL
A/G RATIO: 2.2 (ref 1.2–2.2)
ALK PHOS: 101 IU/L (ref 39–117)
ALT: 51 IU/L — AB (ref 0–44)
AST: 32 IU/L (ref 0–40)
Albumin: 4.3 g/dL (ref 3.5–5.5)
BILIRUBIN TOTAL: 0.9 mg/dL (ref 0.0–1.2)
BUN/Creatinine Ratio: 18 (ref 9–20)
BUN: 19 mg/dL (ref 6–24)
CHLORIDE: 99 mmol/L (ref 96–106)
CO2: 25 mmol/L (ref 18–29)
Calcium: 9.6 mg/dL (ref 8.7–10.2)
Creatinine, Ser: 1.05 mg/dL (ref 0.76–1.27)
GFR calc Af Amer: 91 mL/min/{1.73_m2} (ref >59)
GFR calc non Af Amer: 79 mL/min/{1.73_m2} (ref >59)
GLUCOSE: 95 mg/dL (ref 65–99)
Globulin, Total: 2 g/dL (ref 1.5–4.5)
POTASSIUM: 4.3 mmol/L (ref 3.5–5.2)
Sodium: 141 mmol/L (ref 134–144)
TOTAL PROTEIN: 6.3 g/dL (ref 6.0–8.5)

## 2016-09-01 LAB — LIPID PANEL
CHOL/HDL RATIO: 3.5 ratio (ref 0.0–5.0)
Cholesterol, Total: 166 mg/dL (ref 100–199)
HDL: 48 mg/dL (ref >39)
LDL CALC: 89 mg/dL (ref 0–99)
TRIGLYCERIDES: 147 mg/dL (ref 0–149)
VLDL Cholesterol Cal: 29 mg/dL (ref 5–40)

## 2016-09-06 ENCOUNTER — Ambulatory Visit (INDEPENDENT_AMBULATORY_CARE_PROVIDER_SITE_OTHER): Payer: BLUE CROSS/BLUE SHIELD | Admitting: Cardiovascular Disease

## 2016-09-06 ENCOUNTER — Encounter: Payer: Self-pay | Admitting: Cardiovascular Disease

## 2016-09-06 VITALS — BP 120/90 | HR 58 | Ht 71.0 in | Wt 212.8 lb

## 2016-09-06 DIAGNOSIS — E782 Mixed hyperlipidemia: Secondary | ICD-10-CM

## 2016-09-06 DIAGNOSIS — I10 Essential (primary) hypertension: Secondary | ICD-10-CM | POA: Diagnosis not present

## 2016-09-06 NOTE — Progress Notes (Signed)
Louis Delgado Date of Birth  January 08, 1961       [facility]    [place] [address]  [address] [place], [place]  [address]   [place], [place]  [address] [phone]     [phone]   Fax  [phone]    Fax [phone]  Problem List: 1. Hypertension 2. Hyperlipidemia 3.  Aortic insufficiency   History of Present Illness: Louis Delgado is a 57 y.o. gentleman with a hx of a  aortic insufficiency, hypertension, and hyperlipidemia. He's done well from a cardiac standpoint. He's not had any episodes of chest pain or shortness of breath. He has not been exercising quite as much as he would like but is looking forward to getting back into his exercise regimen.  He has not been getting as much exercise that he would like and he has gained some weight.   He has gained about 5 pounds.   He is otherwise feeling well.  Feb. 27, 2014 Louis Delgado is doing well from a cardiac standpoint.  He has been having some tingling and numbness in his fingers and toes.    It typically resolved if he moves his hands around.  No CP or dyspnea.  November 27, 2012:  Louis Delgado is doing well. He's having any episodes of chest pain or shortness breath. He has bicuspid aortic valve/aortic insufficiency that we have been following. He's not having any symptoms related to the aortic insufficiency. His blood pressure and heart rate at been well-controlled.  Feb. 11, 2015:  Louis Delgado is doing OK.   His TEE showed moderate AI but a 3 leaflet aortic valve.    Getting out and exercising.   His recent labs showed good lipid levels.   October 18, 2014:  Louis Delgado is seen back today for follow up of his aortic insufficiency . Has been seen in the lipid clinic - lipids have been ok Has had a detached retina and has not been exercising as much this past spring. No CP , no dyspnea  August 05, 2015  Louis Delgado is seen back today  Doing well Had an episode of blepharitis  Thinks that he may need another sleep  study. Snores loudly , Had a sleep study 7-8 years ago - had borderline OSA at that time.  More fatigued recently.   Has an AM headache frequently  Exercising several times a week .   Sep 06, 2016:  Louis Delgado is seen today for follow up  Overall feels well Exercising several times a week .     Current Outpatient Prescriptions on File Prior to Visit  Medication Sig Dispense Refill  . aspirin 81 MG tablet Take 81 mg by mouth daily.      . cholecalciferol (VITAMIN D) 1000 UNITS tablet Take 2,000 Units by mouth daily.      . Doxycycline Hyclate 50 MG TABS Take by mouth daily.    . fluorometholone (FML) 0.1 % ophthalmic suspension Place 1 drop into both eyes 2 (two) times daily.    . metoprolol tartrate (LOPRESSOR) 25 MG tablet Take 1 tablet (25 mg total) by mouth 2 (two) times daily. 28 tablet 0  . Omega-3 Fatty Acids (FISH OIL) 1000 MG CPDR Take 2,000 mg by mouth 2 (two) times daily.    . rosuvastatin (CRESTOR) 5 MG tablet PATIENT TAKES CRESTOR 5MG  QOD, OR 4 TIMES WEEKLY. LAST RX SENT SAID '1PO DAILY' PLEASE CORRECT IN YOUR COMPUTER. THANKS 46 tablet 2  No current facility-administered medications on file prior to visit.     Allergies  Allergen Reactions  . Crestor [Rosuvastatin]     Possible memory issue when taking Crestor daily at high dose  . Hctz [Hydrochlorothiazide]     Past Medical History:  Diagnosis Date  . Bicuspid aortic valve    moderate aortic isufficiency  . Chest pain   . Gastroesophageal reflux disease   . Hiatal hernia   . Hypercholesterolemia   . Hyperlipidemia   . Hypertension   . Kidney stones   . Left ventricular hypertrophy    mild    Past Surgical History:  Procedure Laterality Date  . CARDIOVASCULAR STRESS TEST  11-23-2005   EF 55%  . CATARACT EXTRACTION Bilateral November & December 2013  . TEE WITHOUT CARDIOVERSION N/A 01/11/2013   Procedure: TRANSESOPHAGEAL ECHOCARDIOGRAM (TEE);  Surgeon: Vesta MixerPhilip J Tanush Drees, MD;  [facility];  Service:  Cardiovascular;  Laterality: N/A;  . US ECHOCARDIOGRAPHY  06-23-2009   Est EF 55-60%    History  Smoking Status  . Never Smoker  Smokeless Tobacco  . Never Used    History  Alcohol Use No    Family History  Problem Relation Age of Onset  . Dementia Father   . Hypertension Unknown     Reviw of Systems:  Reviewed in the HPI.  All other systems are negative.  Physical Exam: Blood pressure 120/90, pulse (!) 58, height 5\' 11"  (1.803 m), weight 212 lb 12.8 oz (96.5 kg), SpO2 97 %. General: Well developed, well nourished, in no acute distress.  Head: Normocephalic, atraumatic, sclera non-icteric, mucus membranes are moist,   Neck: Supple. Carotids are 2 + without bruits. No JVD  Lungs: Clear bilaterally to auscultation.  Heart: regular rate.  normal  S1 S2. He has a soft systolic murmur.  Abdomen: Soft, non-tender, non-distended with normal bowel sounds. No hepatomegaly. No rebound/guarding. No masses.  Msk:  Strength and tone are normal  Extremities: No clubbing or cyanosis. No edema.  Distal pedal pulses are 2+ and equal bilaterally.  Neuro: Alert and oriented X 3. Moves all extremities spontaneously.  Psych:  Responds to questions appropriately with a normal affect.  ECG:   Sep 06, 2016:   Sinus brady at 5758.   Otherwise normal ECG   Assessment / Plan:   1. Hypertension - BP is well controlled.  Diastolic is 90.   Advised him to walk more and work on weight loss.  Continue meds  2. Hyperlipidemia - lipids are ok , liver enzymes are ok .  Recent labs look  Good   3.  Aortic insufficiency - the TEE shows a 3 leaflet valve ,  Murmur sounds stable   Will see him in 1 year.    Kristeen MissPhilip Kylee Umana, MD  09/06/2016 10:11 AM    [facility] [address]  [address] [place], [place]  [address] Pager [phone]- [phone] Phone: [phone]; Fax: [phone]

## 2016-09-06 NOTE — Patient Instructions (Signed)

## 2016-09-14 ENCOUNTER — Other Ambulatory Visit: Payer: Self-pay | Admitting: *Deleted

## 2016-09-14 MED ORDER — METOPROLOL TARTRATE 25 MG PO TABS: 25.0000 mg | ORAL_TABLET | Freq: Two times a day (BID) | ORAL | 3 refills | Status: DC

## 2016-12-03 ENCOUNTER — Other Ambulatory Visit: Payer: Self-pay | Admitting: Cardiovascular Disease

## 2017-08-23 ENCOUNTER — Other Ambulatory Visit: Payer: Self-pay | Admitting: Nurse Practitioner

## 2017-08-23 DIAGNOSIS — E782 Mixed hyperlipidemia: Secondary | ICD-10-CM

## 2017-08-31 ENCOUNTER — Telehealth: Payer: Self-pay | Admitting: Cardiovascular Disease

## 2017-08-31 ENCOUNTER — Other Ambulatory Visit: Payer: Self-pay | Admitting: *Deleted

## 2017-08-31 MED ORDER — METOPROLOL TARTRATE 25 MG PO TABS: 25.0000 mg | ORAL_TABLET | Freq: Two times a day (BID) | ORAL | 0 refills | Status: DC

## 2017-08-31 NOTE — Telephone Encounter (Signed)
New Message    *STAT* If patient is at the pharmacy, call can be transferred to refill team.   1. Which medications need to be refilled? (please list name of each medication and dose if known) metoprolol tartrate (LOPRESSOR) 25 MG tablet 2. Which pharmacy/location (including street and city if local pharmacy) is medication to be sent to? [facility]  3. Do they need a 30 day or 90 day supply? 90 day supply

## 2017-09-04 ENCOUNTER — Other Ambulatory Visit: Payer: Self-pay | Admitting: Cardiovascular Disease

## 2017-09-06 NOTE — Telephone Encounter (Signed)
Outpatient Medication Detail    Disp Refills Start End   metoprolol tartrate (LOPRESSOR) 25 MG tablet 180 tablet 0 08/31/2017    Sig - Route: Take 1 tablet (25 mg total) by mouth 2 (two) times daily. - Oral   Sent to pharmacy as: metoprolol tartrate (LOPRESSOR) 25 MG tablet   E-Prescribing Status: Receipt confirmed by pharmacy (08/31/2017 10:24 AM EDT)   Pharmacy   [facility] [phone] - [place], [place] - [id number]

## 2017-09-07 ENCOUNTER — Emergency Department (HOSPITAL_COMMUNITY): Payer: BLUE CROSS/BLUE SHIELD

## 2017-09-07 ENCOUNTER — Other Ambulatory Visit: Payer: Self-pay

## 2017-09-07 ENCOUNTER — Encounter (HOSPITAL_COMMUNITY): Payer: Self-pay | Admitting: Emergency Medicine

## 2017-09-07 ENCOUNTER — Emergency Department (HOSPITAL_COMMUNITY)
Admission: EM | Admit: 2017-09-07 | Discharge: 2017-09-07 | Disposition: A | Discharge status: Home / Self Care | Payer: BLUE CROSS/BLUE SHIELD | Attending: Physician Assistant | Admitting: Physician Assistant

## 2017-09-07 DIAGNOSIS — N23 Unspecified renal colic: Secondary | ICD-10-CM | POA: Diagnosis not present

## 2017-09-07 DIAGNOSIS — I10 Essential (primary) hypertension: Secondary | ICD-10-CM | POA: Diagnosis not present

## 2017-09-07 DIAGNOSIS — N200 Calculus of kidney: Secondary | ICD-10-CM | POA: Diagnosis not present

## 2017-09-07 DIAGNOSIS — Z7982 Long term (current) use of aspirin: Secondary | ICD-10-CM | POA: Diagnosis not present

## 2017-09-07 DIAGNOSIS — Z79899 Other long term (current) drug therapy: Secondary | ICD-10-CM | POA: Insufficient documentation

## 2017-09-07 DIAGNOSIS — R109 Unspecified abdominal pain: Secondary | ICD-10-CM | POA: Diagnosis not present

## 2017-09-07 LAB — BASIC METABOLIC PANEL
Anion gap: 11 (ref 5–15)
BUN: 20 mg/dL (ref 6–20)
CALCIUM: 8.8 mg/dL — AB (ref 8.9–10.3)
CO2: 24 mmol/L (ref 22–32)
CREATININE: 1.15 mg/dL (ref 0.61–1.24)
Chloride: 106 mmol/L (ref 101–111)
Glucose, Bld: 126 mg/dL — ABNORMAL HIGH (ref 65–99)
Potassium: 4.7 mmol/L (ref 3.5–5.1)
SODIUM: 141 mmol/L (ref 135–145)

## 2017-09-07 LAB — CBC WITH DIFFERENTIAL/PLATELET
BASOS PCT: 0 %
Basophils Absolute: 0 10*3/uL (ref 0.0–0.1)
EOS ABS: 0 10*3/uL (ref 0.0–0.7)
EOS PCT: 0 %
HCT: 46.7 % (ref 39.0–52.0)
HEMOGLOBIN: 16.4 g/dL (ref 13.0–17.0)
Lymphocytes Relative: 7 %
Lymphs Abs: 0.6 10*3/uL — ABNORMAL LOW (ref 0.7–4.0)
MCH: 32.6 pg (ref 26.0–34.0)
MCHC: 35.1 g/dL (ref 30.0–36.0)
MCV: 92.8 fL (ref 78.0–100.0)
Monocytes Absolute: 0.3 10*3/uL (ref 0.1–1.0)
Monocytes Relative: 3 %
NEUTROS PCT: 90 %
Neutro Abs: 7.4 10*3/uL (ref 1.7–7.7)
PLATELETS: 240 10*3/uL (ref 150–400)
RBC: 5.03 MIL/uL (ref 4.22–5.81)
RDW: 12.2 % (ref 11.5–15.5)
WBC: 8.3 10*3/uL (ref 4.0–10.5)

## 2017-09-07 MED ORDER — HYDROMORPHONE HCL 1 MG/ML IJ SOLN
0.5000 mg | Freq: Once | INTRAMUSCULAR | Status: AC
  Administered 2017-09-07: 0.5 mg via INTRAVENOUS
  Filled 2017-09-07: qty 1

## 2017-09-07 MED ORDER — ONDANSETRON 4 MG PO TBDP: 4.0000 mg | ORAL_TABLET | Freq: Three times a day (TID) | ORAL | 0 refills | Status: DC | PRN

## 2017-09-07 MED ORDER — SODIUM CHLORIDE 0.9 % IV BOLUS
1000.0000 mL | Freq: Once | INTRAVENOUS | Status: AC
  Administered 2017-09-07: 1000 mL via INTRAVENOUS

## 2017-09-07 MED ORDER — TAMSULOSIN HCL 0.4 MG PO CAPS: 0.4000 mg | ORAL_CAPSULE | Freq: Every day | ORAL | 0 refills | Status: DC

## 2017-09-07 MED ORDER — OXYCODONE-ACETAMINOPHEN 5-325 MG PO TABS: 1.0000 | ORAL_TABLET | Freq: Three times a day (TID) | ORAL | 0 refills | Status: DC | PRN

## 2017-09-07 NOTE — ED Triage Notes (Addendum)
Pt complaint of left flank pain; concern for kidney stone; urine tested at [facility] with positive for blood in urine; given 60 IM Toradol just PTA.

## 2017-09-07 NOTE — ED Provider Notes (Signed)
[facility]-EMERGENCY DEPT Provider Note   CSN: [id number] Arrival date & time: 09/07/17  1001     History   Chief Complaint Chief Complaint  Patient presents with  . Flank Pain    HPI Louis Delgado is a 57 y.o. male with a past medical history of kidney stones, hypertension, hyperlipidemia, who presents to ED for evaluation of left flank pain.  Symptoms have been going on for the past 3 hours.  Associated with one episode of nonbloody, nonbilious emesis several minutes ago.  Began suddenly after he woke up.  Pain is been constant and does not radiate.  He was seen and evaluated at [facility] walk-in clinic and was sent to the ED for concern for kidney stone after hematuria was found on urinalysis.  Reports improvement with a 60 mg of IM Toradol given prior to arrival.  He also took ibuprofen when symptoms began.  Patient states that he was diagnosed with a left-sided kidney stone 10 years ago.  Denies any fever, dysuria, gross hematuria, injuries or falls, changes in bowel movements, nausea.  HPI  Past Medical History:  Diagnosis Date  . Bicuspid aortic valve    moderate aortic isufficiency  . Chest pain   . Gastroesophageal reflux disease   . Hiatal hernia   . Hypercholesterolemia   . Hyperlipidemia   . Hypertension   . Kidney stones   . Left ventricular hypertrophy    mild    Patient Active Problem List   Diagnosis Date Noted  . Snoring 11/21/2015  . Excessive daytime sleepiness 11/21/2015  . Aortic insufficiency 05/30/2013  . Hyperlipidemia 11/18/2010  . Hypertension 11/18/2010    Past Surgical History:  Procedure Laterality Date  . CARDIOVASCULAR STRESS TEST  11-23-2005   EF 55%  . CATARACT EXTRACTION Bilateral November & December 2013  . TEE WITHOUT CARDIOVERSION N/A 01/11/2013   Procedure: TRANSESOPHAGEAL ECHOCARDIOGRAM (TEE);  Surgeon: Vesta Mixer, MD;  Location: [facility] ENDOSCOPY;  Service: Cardiovascular;  Laterality: N/A;  . US  ECHOCARDIOGRAPHY  06-23-2009   Est EF 55-60%        Home Medications    Prior to Admission medications   Medication Sig Start Date End Date Taking? Authorizing Provider  aspirin 81 MG tablet Take 81 mg by mouth at bedtime.    Yes [provider]  cholecalciferol (VITAMIN D) 1000 UNITS tablet Take 1,000 Units by mouth daily.    Yes [provider]  Doxycycline Hyclate 50 MG TABS Take 50 mg by mouth daily.    Yes [provider]  fluorometholone (FML) 0.1 % ophthalmic suspension Place 1 drop into both eyes 2 (two) times daily.   Yes [provider]  ibuprofen (ADVIL,MOTRIN) 200 MG tablet Take 400 mg by mouth every 6 (six) hours as needed for moderate pain.   Yes [provider]  metoprolol tartrate (LOPRESSOR) 25 MG tablet Take 1 tablet (25 mg total) by mouth 2 (two) times daily. 08/31/17  Yes Nahser, Deloris Ping, MD  Omega-3 Fatty Acids (FISH OIL) 1000 MG CPDR Take 2,000 mg by mouth 2 (two) times daily. Patient taking differently: Take 2,000 mg by mouth daily.  12/06/13  Yes Nahser, Deloris Ping, MD  rosuvastatin (CRESTOR) 5 MG tablet TAKE 1 TABLET (5 MG TOTAL) BY MOUTH DAILY AT 6 PM. Patient taking differently: TAKE 1 TABLET (5 MG TOTAL) BY MOUTH DAILY AT 6 PM on Monday, Wednesday, Friday and Saturday 12/06/16  Yes Nahser, Deloris Ping, MD  ondansetron (ZOFRAN ODT) 4  MG disintegrating tablet Take 1 tablet (4 mg total) by mouth every 8 (eight) hours as needed for nausea or vomiting. 09/07/17   Azula Zappia, PA-C  oxyCODONE-acetaminophen (PERCOCET/ROXICET) 5-325 MG tablet Take 1 tablet by mouth every 8 (eight) hours as needed for severe pain. 09/07/17   Kristl Morioka, PA-C  rosuvastatin (CRESTOR) 5 MG tablet PATIENT TAKES CRESTOR  QOD, OR 4 TIMES WEEKLY. LAST RX SENT SAID '1PO DAILY' PLEASE CORRECT IN YOUR COMPUTER. THANKS Patient not taking: Reported on 09/07/2017 10/22/14   Nahser, Deloris Ping, MD  tamsulosin (FLOMAX) 0.4 MG CAPS capsule Take 1 capsule (0.4 mg total)  by mouth daily after supper. 09/07/17   Dietrich Pates, PA-C    Family History Family History  Problem Relation Age of Onset  . Dementia Father   . Hypertension Unknown     Social History Social History   Tobacco Use  . Smoking status: Never Smoker  . Smokeless tobacco: Never Used  Substance Use Topics  . Alcohol use: No  . Drug use: No     Allergies   Crestor [rosuvastatin] and Hctz [hydrochlorothiazide]   Review of Systems Review of Systems  Constitutional: Negative for appetite change, chills and fever.  HENT: Negative for ear pain, rhinorrhea, sneezing and sore throat.   Eyes: Negative for photophobia and visual disturbance.  Respiratory: Negative for cough, chest tightness, shortness of breath and wheezing.   Cardiovascular: Negative for chest pain and palpitations.  Gastrointestinal: Negative for abdominal pain, blood in stool, constipation, diarrhea, nausea and vomiting.  Genitourinary: Positive for flank pain and hematuria. Negative for dysuria and urgency.  Musculoskeletal: Negative for myalgias.  Skin: Negative for rash.  Neurological: Negative for dizziness, weakness and light-headedness.     Physical Exam Updated Vital Signs BP (!) 138/96 (BP Location: Right Arm)   Pulse 91   Temp 97.6 F (36.4 C) (Oral)   Resp 20   SpO2 98%   Physical Exam  Constitutional: He appears well-developed and well-nourished. No distress.  Uncomfortable.  HENT:  Head: Normocephalic and atraumatic.  Nose: Nose normal.  Eyes: Conjunctivae and EOM are normal. Right eye exhibits no discharge. Left eye exhibits no discharge. No scleral icterus.  Neck: Normal range of motion. Neck supple.  Cardiovascular: Normal rate, regular rhythm, normal heart sounds and intact distal pulses. Exam reveals no gallop and no friction rub.  No murmur heard. Pulmonary/Chest: Effort normal and breath sounds normal. No respiratory distress.  Abdominal: Soft. Bowel sounds are normal. He exhibits no  distension. There is tenderness. There is no guarding.    L flank TTP.  Musculoskeletal: Normal range of motion. He exhibits no edema.  Neurological: He is alert. He exhibits normal muscle tone. Coordination normal.  Skin: Skin is warm and dry. No rash noted.  Psychiatric: He has a normal mood and affect.  Nursing note and vitals reviewed.    ED Treatments / Results  Labs (all labs ordered are listed, but only abnormal results are displayed) Labs Reviewed  BASIC METABOLIC PANEL - Abnormal; Notable for the following components:      Result Value   Glucose, Bld 126 (*)    Calcium 8.8 (*)    All other components within normal limits  CBC WITH DIFFERENTIAL/PLATELET - Abnormal; Notable for the following components:   Lymphs Abs 0.6 (*)    All other components within normal limits       EKG None  Radiology Ct Renal Stone Study  Result Date: 09/07/2017 CLINICAL DATA:  Left flank  pain and hematuria. EXAM: CT ABDOMEN AND PELVIS WITHOUT CONTRAST TECHNIQUE: Multidetector CT imaging of the abdomen and pelvis was performed following the standard protocol without IV contrast. COMPARISON:  CT scan 04/24/2007 FINDINGS: Lower chest: Streaky subsegmental bibasilar atelectasis but no infiltrates or effusions. No worrisome pulmonary lesions. Moderate eventration of the right hemidiaphragm. The heart is normal in size. No pericardial effusion. Coronary artery calcifications are noted. The distal esophagus is grossly normal. There is a small hiatal hernia. Hepatobiliary: No focal hepatic lesions or intrahepatic biliary dilatation. The gallbladder is normal. No common bile duct dilatation. Pancreas: No mass, inflammation or ductal dilatation. Spleen: Normal size.  No focal lesions. Adrenals/Urinary Tract: The adrenal glands are unremarkable. Mild to moderate left-sided hydronephrosis due to a an obstructing 7 x 5 mm calculus at the UPJ. No distal left ureteral calculi and no right-sided renal or ureteral  calculi. Lower pole left renal calculi are noted. No worrisome renal or bladder lesions without contrast. Stomach/Bowel: The stomach, duodenum, small bowel and colon are grossly normal without oral contrast. No inflammatory changes, mass lesions or obstructive findings. The terminal ileum and appendix are normal. Vascular/Lymphatic: The aorta is normal in caliber. No atheroscerlotic calcifications. No mesenteric of retroperitoneal mass or adenopathy. Small scattered lymph nodes are noted. Reproductive: The prostate gland and seminal vesicles are unremarkable. Other: Small bilateral inguinal hernias containing fat. Small periumbilical abdominal wall hernia containing fat. Musculoskeletal: No significant bony findings. IMPRESSION: 1. 7 x 5 mm left UPJ calculus with moderate left-sided hydronephrosis. 2. Lower pole left renal calculi. No right-sided renal or ureteral calculi. 3. No other acute abdominal/pelvic findings and no mass lesions or adenopathy. Electronically Signed   By: Rudie Meyer M.D.   On: 09/07/2017 12:44    Procedures Procedures (including critical care time)  Medications Ordered in ED Medications  sodium chloride 0.9 % bolus 1,000 mL (1,000 mLs Intravenous New Bag/Given 09/07/17 1152)  HYDROmorphone (DILAUDID) injection 0.5 mg (0.5 mg Intravenous Given 09/07/17 1152)     Initial Impression / Assessment and Plan / ED Course  I have reviewed the triage vital signs and the nursing notes.  Pertinent labs & imaging results that were available during my care of the patient were reviewed by me and considered in my medical decision making (see chart for details).  Clinical Course as of Sep 08 1527  Wed Sep 07, 2017  1256 Patient resting comfortably on examination.  CT shows 5 x 7 mm left stone at UPJ.   [HK]  1258 Labs unable to be obtained yet. RN states will pull from IV after bolus completed.   [HK]    Clinical Course User Index [HK] Dietrich Pates, PA-C    Patient presents to ED  for evaluation of 3-hour history of acute onset left-sided flank pain.  Associated one episode of nonbloody, nonbilious emesis.  12 he was sent here from a walk-in clinic for concern for kidney stones after hematuria was found and urinalysis.  Reports significant improvement with 30 mg of IM Toradol given prior to arrival.  Denies any fever, dysuria, gross hematuria, injuries or falls, loss of bowel or bladder function.  On physical overall well-appearing.  He does appear slightly uncomfortable although states that it has improved since medication was given.  He is afebrile.  CT renal stone study shows 7 x 5 mm left UPJ with moderate left-sided hydronephrosis.  CBC, BMP unremarkable. BUN and Cr unremarkable. Reports improvement in symptoms with medication, fluids given here.  Urinalysis showing above with  no evidence of infection.  Will discharge home with pain medication, antiemetics and Flomax to help with symptoms.  Will give number for urologist on call to follow-up in 5 days if symptoms do not improve.  No suspicion for infected stone as he is afebrile and remainder of lab work and imaging is unremarkable.  Advised to return to ED for any severe worsening symptoms. [place] narcotic database reviewed with no discrepancies.  Portions of this note were generated with Scientist, clinical (histocompatibility and immunogenetics). Dictation errors may occur despite best attempts at proofreading.   Final Clinical Impressions(s) / ED Diagnoses   Final diagnoses:  Nephrolithiasis    ED Discharge Orders        Ordered    oxyCODONE-acetaminophen (PERCOCET/ROXICET) 5-325 MG tablet  Every 8 hours PRN     09/07/17 1528    ondansetron (ZOFRAN ODT) 4 MG disintegrating tablet  Every 8 hours PRN     09/07/17 1528    tamsulosin (FLOMAX) 0.4 MG CAPS capsule  Daily after supper     09/07/17 1528       Dietrich Pates, PA-C 09/07/17 1531    Mackuen, Cindee Salt, MD 09/10/17 1709

## 2017-09-07 NOTE — Discharge Instructions (Addendum)
Return to the emergency room if you develop a fever, worsening of her pain or vomiting.

## 2017-09-08 DIAGNOSIS — N201 Calculus of ureter: Secondary | ICD-10-CM | POA: Diagnosis not present

## 2017-09-09 ENCOUNTER — Other Ambulatory Visit: Payer: Self-pay | Admitting: Urology

## 2017-09-15 ENCOUNTER — Encounter (HOSPITAL_COMMUNITY): Payer: Self-pay | Admitting: *Deleted

## 2017-09-16 ENCOUNTER — Other Ambulatory Visit: Payer: Self-pay

## 2017-09-16 ENCOUNTER — Encounter (HOSPITAL_COMMUNITY)
Admission: RE | Admit: 2017-09-16 | Discharge: 2017-09-16 | Disposition: A | Discharge status: Home / Self Care | Payer: BLUE CROSS/BLUE SHIELD | Source: Ambulatory Visit | Attending: Urology | Admitting: Urology

## 2017-09-16 DIAGNOSIS — Z01810 Encounter for preprocedural cardiovascular examination: Secondary | ICD-10-CM | POA: Diagnosis not present

## 2017-09-16 DIAGNOSIS — R9431 Abnormal electrocardiogram [ECG] [EKG]: Secondary | ICD-10-CM | POA: Insufficient documentation

## 2017-09-19 ENCOUNTER — Other Ambulatory Visit: Payer: Self-pay

## 2017-09-19 ENCOUNTER — Encounter (HOSPITAL_COMMUNITY): Payer: Self-pay | Admitting: *Deleted

## 2017-09-19 ENCOUNTER — Encounter (HOSPITAL_COMMUNITY)
Admission: RE | Disposition: A | Discharge status: Home / Self Care | Payer: Self-pay | Source: Ambulatory Visit | Attending: Urology

## 2017-09-19 ENCOUNTER — Ambulatory Visit (HOSPITAL_COMMUNITY)
Admission: RE | Admit: 2017-09-19 | Discharge: 2017-09-19 | Disposition: A | Discharge status: Home / Self Care | Payer: BLUE CROSS/BLUE SHIELD | Source: Ambulatory Visit | Attending: Urology | Admitting: Urology

## 2017-09-19 ENCOUNTER — Ambulatory Visit (HOSPITAL_COMMUNITY): Payer: BLUE CROSS/BLUE SHIELD

## 2017-09-19 DIAGNOSIS — N201 Calculus of ureter: Secondary | ICD-10-CM | POA: Diagnosis not present

## 2017-09-19 DIAGNOSIS — I10 Essential (primary) hypertension: Secondary | ICD-10-CM | POA: Diagnosis not present

## 2017-09-19 DIAGNOSIS — Z79899 Other long term (current) drug therapy: Secondary | ICD-10-CM | POA: Insufficient documentation

## 2017-09-19 DIAGNOSIS — Z7982 Long term (current) use of aspirin: Secondary | ICD-10-CM | POA: Insufficient documentation

## 2017-09-19 DIAGNOSIS — N200 Calculus of kidney: Secondary | ICD-10-CM | POA: Diagnosis not present

## 2017-09-19 DIAGNOSIS — N132 Hydronephrosis with renal and ureteral calculous obstruction: Secondary | ICD-10-CM | POA: Insufficient documentation

## 2017-09-19 DIAGNOSIS — E7800 Pure hypercholesterolemia, unspecified: Secondary | ICD-10-CM | POA: Diagnosis not present

## 2017-09-19 HISTORY — DX: Personal history of urinary calculi: Z87.442

## 2017-09-19 HISTORY — PX: EXTRACORPOREAL SHOCK WAVE LITHOTRIPSY: SHX1557

## 2017-09-19 SURGERY — LITHOTRIPSY, ESWL
Anesthesia: LOCAL | Laterality: Left

## 2017-09-19 MED ORDER — CIPROFLOXACIN HCL 500 MG PO TABS
500.0000 mg | ORAL_TABLET | ORAL | Status: AC
  Administered 2017-09-19: 500 mg via ORAL
  Filled 2017-09-19: qty 1

## 2017-09-19 MED ORDER — SODIUM CHLORIDE 0.9 % IV SOLN
INTRAVENOUS | Status: DC
  Administered 2017-09-19: 09:00:00 via INTRAVENOUS

## 2017-09-19 MED ORDER — DIPHENHYDRAMINE HCL 25 MG PO CAPS
25.0000 mg | ORAL_CAPSULE | ORAL | Status: AC
  Administered 2017-09-19: 25 mg via ORAL
  Filled 2017-09-19: qty 1

## 2017-09-19 MED ORDER — DIAZEPAM 5 MG PO TABS
10.0000 mg | ORAL_TABLET | ORAL | Status: AC
  Administered 2017-09-19: 10 mg via ORAL
  Filled 2017-09-19: qty 2

## 2017-09-19 NOTE — Discharge Instructions (Addendum)
Moderate Conscious Sedation, Adult, Care After °These instructions provide you with information about caring for yourself after your procedure. Your health care provider may also give you more specific instructions. Your treatment has been planned according to current medical practices, but problems sometimes occur. Call your health care provider if you have any problems or questions after your procedure. °What can I expect after the procedure? °After your procedure, it is common: °· To feel sleepy for several hours. °· To feel clumsy and have poor balance for several hours. °· To have poor judgment for several hours. °· To vomit if you eat too soon. ° °Follow these instructions at home: °For at least 24 hours after the procedure: ° °· Do not: °? Participate in activities where you could fall or become injured. °? Drive. °? Use heavy machinery. °? Drink alcohol. °? Take sleeping pills or medicines that cause drowsiness. °? Make important decisions or sign legal documents. °? Take care of children on your own. °· Rest. °Eating and drinking °· Follow the diet recommended by your health care provider. °· If you vomit: °? Drink water, juice, or soup when you can drink without vomiting. °? Make sure you have little or no nausea before eating solid foods. °General instructions °· Have a responsible adult stay with you until you are awake and alert. °· Take over-the-counter and prescription medicines only as told by your health care provider. °· If you smoke, do not smoke without supervision. °· Keep all follow-up visits as told by your health care provider. This is important. °Contact a health care provider if: °· You keep feeling nauseous or you keep vomiting. °· You feel light-headed. °· You develop a rash. °· You have a fever. °Get help right away if: °· You have trouble breathing. °This information is not intended to replace advice given to you by your health care provider. Make sure you discuss any questions you have  with your health care provider. °Document Released: 01/24/2013 Document Revised: 09/08/2015 Document Reviewed: 07/26/2015 °Elsevier Interactive Patient Education © 2018 Elsevier Inc. °Lithotripsy, Care After °This sheet gives you information about how to care for yourself after your procedure. Your health care provider may also give you more specific instructions. If you have problems or questions, contact your health care provider. °What can I expect after the procedure? °After the procedure, it is common to have: °· Some blood in your urine. This should only last for a few days. °· Soreness in your back, sides, or upper abdomen for a few days. °· Blotches or bruises on your back where the pressure wave entered the skin. °· Pain, discomfort, or nausea when pieces (fragments) of the kidney stone move through the tube that carries urine from the kidney to the bladder (ureter). Stone fragments may pass soon after the procedure, but they may continue to pass for up to 4-8 weeks. °? If you have severe pain or nausea, contact your health care provider. This may be caused by a large stone that was not broken up, and this may mean that you need more treatment. °· Some pain or discomfort during urination. °· Some pain or discomfort in the lower abdomen or (in men) at the base of the penis. ° °Follow these instructions at home: °Medicines °· Take over-the-counter and prescription medicines only as told by your health care provider. °· If you were prescribed an antibiotic medicine, take it as told by your health care provider. Do not stop taking the antibiotic even if you   start to feel better.  Do not drive for 24 hours if you were given a medicine to help you relax (sedative).  Do not drive or use heavy machinery while taking prescription pain medicine. Eating and drinking  Drink enough water and fluids to keep your urine clear or pale yellow. This helps any remaining pieces of the stone to pass. It can also help  prevent new stones from forming.  Eat plenty of fresh fruits and vegetables.  Follow instructions from your health care provider about eating and drinking restrictions. You may be instructed: ? To reduce how much salt (sodium) you eat or drink. Check ingredients and nutrition facts on packaged foods and beverages. ? To reduce how much meat you eat.  Eat the recommended amount of calcium for your age and gender. Ask your health care provider how much calcium you should have. General instructions  Get plenty of rest.  Most people can resume normal activities 1-2 days after the procedure. Ask your health care provider what activities are safe for you.  If directed, strain all urine through the strainer that was provided by your health care provider. ? Keep all fragments for your health care provider to see. Any stones that are found may be sent to a medical lab for examination. The stone may be as small as a grain of salt.  Keep all follow-up visits as told by your health care provider. This is important. Contact a health care provider if:  You have pain that is severe or does not get better with medicine.  You have nausea that is severe or does not go away.  You have blood in your urine longer than your health care provider told you to expect.  You have more blood in your urine.  You have pain during urination that does not go away.  You urinate more frequently than usual and this does not go away.  You develop a rash or any other possible signs of an allergic reaction. Get help right away if:  You have severe pain in your back, sides, or upper abdomen.  You have severe pain while urinating.  Your urine is very dark red.  You have blood in your stool (feces).  You cannot pass any urine at all.  You feel a strong urge to urinate after emptying your bladder.  You have a fever or chills.  You develop shortness of breath, difficulty breathing, or chest pain.  You have  severe nausea that leads to persistent vomiting.  You faint. Summary  After this procedure, it is common to have some pain, discomfort, or nausea when pieces (fragments) of the kidney stone move through the tube that carries urine from the kidney to the bladder (ureter). If this pain or nausea is severe, however, you should contact your health care provider.  Most people can resume normal activities 1-2 days after the procedure. Ask your health care provider what activities are safe for you.  Drink enough water and fluids to keep your urine clear or pale yellow. This helps any remaining pieces of the stone to pass, and it can help prevent new stones from forming.  If directed, strain your urine and keep all fragments for your health care provider to see. Fragments or stones may be as small as a grain of salt.  Get help right away if you have severe pain in your back, sides, or upper abdomen or have severe pain while urinating. This information is not intended to replace advice  given to you by your health care provider. Make sure you discuss any questions you have with your health care provider. Document Released: 04/25/2007 Document Revised: 02/25/2016 Document Reviewed: 02/25/2016 Elsevier Interactive Patient Education  2018 [employer]  See discharge paperwork included

## 2017-09-19 NOTE — H&P (Signed)
CC: I have ureteral stone.  HPI: Louis Delgado is a 57 year-old male patient who was referred by Dr. Vianne Bulls Alan Ross, MD who is here for ureteral stone.  The problem is on the left side. He first stated noticing pain on approximately 09/07/2017. This is not his first kidney stone. He is not currently having flank pain, back pain, groin pain, nausea, vomiting, fever or chills. Pain is occuring on the left side. He has not caught a stone in his urine strainer since his symptoms began.   He has never had surgical treatment for calculi in the past.   He underwent a CT scan of the abdomen and pelvis which revealed a 6-7 mm left UPJ stone with mild hydronephrosis (hu 1210, ssd 15 cm, ? visible). There was also a 6 mm left lower pole stone. No other stones. His white count was 8.3, creatinine 1.15. No UA.   UA today with rare bacteria and 10-20 red cells. Doing well. Staying hydrated. Saw Dr. Brunilda PayorNesi in "09 for left renal stones and they continued surveillance. KUB today showed a 6 mm left proximal stone. 4 mm LLP and 2 mm LLP stones.     ALLERGIES: No Allergies    MEDICATIONS: Crestor  Metoprolol Tartrate 25 mg tablet  Aspirin 81 MG TABS Oral  Fish Oil     GU PSH: None   NON-GU PSH: Cataract surgery, Bilateral Repair Detached Retina    GU PMH: Personal Hx Oth male genital organs diseases, History of epididymitis - 2014      PMH Notes:  2008-05-13 14:17:02 - Note: Normal Routine History And Physical Adult  2007-04-28 13:24:21 - Note: Aortic Regurgitation  2008-05-13 15:05:38 - Note: Urinary Calculus On The Left   NON-GU PMH: Cardiac murmur, unspecified GERD Hypercholesterolemia Hypertension    FAMILY HISTORY: Death of family member - Father Dementia - Father   SOCIAL HISTORY: Marital Status: Single Preferred Language: English; Ethnicity: Not Hispanic Or Latino; Race: White Current Smoking Status: Patient has never smoked.   Tobacco Use Assessment Completed: Used Tobacco in last 30  days? Does not use smokeless tobacco. Has never drank.  Drinks 1 caffeinated drink per day. Patient's occupation is/was [employer].     Notes: Caffeine Use, Marital History - Single, Occupation:   REVIEW OF SYSTEMS:    GU Review Male:   Patient denies frequent urination, hard to postpone urination, burning/ pain with urination, get up at night to urinate, leakage of urine, stream starts and stops, trouble starting your stream, have to strain to urinate , erection problems, and penile pain.  Gastrointestinal (Upper):   Patient denies nausea, vomiting, and indigestion/ heartburn.  Gastrointestinal (Lower):   Patient denies diarrhea and constipation.  Constitutional:   Patient denies fever, night sweats, weight loss, and fatigue.  Skin:   Patient denies skin rash/ lesion and itching.  Eyes:   Patient denies double vision and blurred vision.  Ears/ Nose/ Throat:   Patient denies sore throat and sinus problems.  Hematologic/Lymphatic:   Patient denies swollen glands and easy bruising.  Cardiovascular:   Patient denies leg swelling and chest pains.  Respiratory:   Patient denies cough and shortness of breath.  Endocrine:   Patient denies excessive thirst.  Musculoskeletal:   Patient denies back pain and joint pain.  Neurological:   Patient denies headaches and dizziness.  Psychologic:   Patient denies depression and anxiety.   VITAL SIGNS:      09/08/2017 10:26 AM  Weight 211 lb /  95.71 kg  Height 71 in / 180.34 cm  BP 146/98 mmHg  Pulse 59 /min  Temperature 98.1 F / 36.7 C  BMI 29.4 kg/m   MULTI-SYSTEM PHYSICAL EXAMINATION:    Constitutional: Well-nourished. No physical deformities. Normally developed. Good grooming.  Neck: Neck symmetrical, not swollen. Normal tracheal position.  Respiratory: No labored breathing, no use of accessory muscles. Normal breath sounds.  Cardiovascular: Regular rate and rhythm. Normal temperature, normal extremity pulses, no swelling, no  varicosities.   Lymphatic: No enlargement of neck, axillae, groin.  Skin: No paleness, no jaundice, no cyanosis. No lesion, no ulcer, no rash.  Neurologic / Psychiatric: Oriented to time, oriented to place, oriented to person. No depression, no anxiety, no agitation.  Gastrointestinal: No mass, no tenderness, no rigidity, non obese abdomen.  Eyes: Normal conjunctivae. Normal eyelids.  Ears, Nose, Mouth, and Throat: Left ear no scars, no lesions, no masses. Right ear no scars, no lesions, no masses. Nose no scars, no lesions, no masses. Normal hearing. Normal lips.  Musculoskeletal: Normal gait and station of head and neck.     PAST DATA REVIEWED:  Source Of History:  Patient  Records Review:   Previous Hospital Records  X-Ray Review: C.T. Abdomen/Pelvis: Reviewed Films. 08/2017    PROCEDURES:         KUB - 74018  A single view of the abdomen is obtained.  Calculi:  4 mm LLP and 2 mm LLP stones.       The bones appeared normal. The bowel gas pattern appeared normal. The soft tissues were unremarkable.         Urinalysis w/Scope Dipstick Dipstick Cont'd Micro  Color: Yellow Bilirubin: Neg WBC/hpf: NS (Not Seen)  Appearance: Clear Ketones: Neg RBC/hpf: 10 - 20/hpf  Specific Gravity: 1.025 Blood: 3+ Bacteria: Rare (0-9/hpf)  pH: <=5.0 Protein: Trace Cystals: NS (Not Seen)  Glucose: Neg Urobilinogen: 0.2 Casts: NS (Not Seen)    Nitrites: Neg Trichomonas: Not Present    Leukocyte Esterase: Neg Mucous: Not Present      Epithelial Cells: NS (Not Seen)      Yeast: NS (Not Seen)      Sperm: Not Present    ASSESSMENT:      ICD-10 Details  1 GU:   Ureteral calculus - N20.1    PLAN:           Orders X-Rays: KUB          Schedule Return Visit/Planned Activity: Next Available Appointment - Schedule Surgery          Document Letter(s):  Created for Patient: Clinical Summary         Notes:   left ureteral stone -- I discussed with the patient the nature risks and benefits of  continued stone passage, off label use of alpha blockers, shockwave lithotripsy or ureteroscopy. All questions answered. He'll proceed with left ESWL. Discussed one of my colleagues may be doing the procedure. We discussed we would treat the ureteral stone and continue to monitor the renal stones.   cc: Dr. Tenny Craw      Signed by Jerilee Field, M.D. on 09/08/17 at 6:03 PM (EDT)

## 2017-09-20 ENCOUNTER — Encounter (HOSPITAL_COMMUNITY): Payer: Self-pay | Admitting: Urology

## 2017-09-25 ENCOUNTER — Encounter: Payer: Self-pay | Admitting: Cardiovascular Disease

## 2017-10-12 DIAGNOSIS — N201 Calculus of ureter: Secondary | ICD-10-CM | POA: Diagnosis not present

## 2017-10-12 DIAGNOSIS — N200 Calculus of kidney: Secondary | ICD-10-CM | POA: Diagnosis not present

## 2017-11-07 DIAGNOSIS — N200 Calculus of kidney: Secondary | ICD-10-CM | POA: Diagnosis not present

## 2017-11-25 ENCOUNTER — Other Ambulatory Visit: Payer: Self-pay | Admitting: Cardiovascular Disease

## 2017-11-29 ENCOUNTER — Telehealth: Payer: Self-pay | Admitting: Cardiovascular Disease

## 2017-11-29 MED ORDER — METOPROLOL TARTRATE 25 MG PO TABS: ORAL_TABLET | ORAL | 3 refills | Status: DC

## 2017-11-29 MED ORDER — METOPROLOL TARTRATE 25 MG PO TABS: 25.0000 mg | ORAL_TABLET | Freq: Two times a day (BID) | ORAL | 3 refills | Status: DC

## 2017-11-29 NOTE — Telephone Encounter (Signed)
Patient had requested refill of metoprolol.  According to prescription 'report', it was not confirmed by pharmacy-transmission failed.  I reordered this and it did go through.  Pt is aware and appreciative for the assistance.

## 2017-11-29 NOTE — Telephone Encounter (Signed)
New Message:  Patient calling concerning medication Metoprolol was decline, Patient would like to know why, He needs a refill

## 2017-11-29 NOTE — Addendum Note (Signed)
Addended by: Vernard GamblesOLE, Bryony Kaman S on: 11/29/2017 08:52 AM   Modules accepted: Orders

## 2017-12-15 ENCOUNTER — Other Ambulatory Visit: Payer: BLUE CROSS/BLUE SHIELD | Admitting: *Deleted

## 2017-12-15 DIAGNOSIS — E782 Mixed hyperlipidemia: Secondary | ICD-10-CM | POA: Diagnosis not present

## 2017-12-15 LAB — BASIC METABOLIC PANEL
BUN / CREAT RATIO: 19 (ref 9–20)
BUN: 18 mg/dL (ref 6–24)
CALCIUM: 9.2 mg/dL (ref 8.7–10.2)
CHLORIDE: 100 mmol/L (ref 96–106)
CO2: 24 mmol/L (ref 20–29)
Creatinine, Ser: 0.96 mg/dL (ref 0.76–1.27)
GFR calc Af Amer: 101 mL/min/{1.73_m2} (ref >59)
GFR calc non Af Amer: 87 mL/min/{1.73_m2} (ref >59)
GLUCOSE: 89 mg/dL (ref 65–99)
POTASSIUM: 4.5 mmol/L (ref 3.5–5.2)
Sodium: 140 mmol/L (ref 134–144)

## 2017-12-15 LAB — HEPATIC FUNCTION PANEL
ALT: 36 IU/L (ref 0–44)
AST: 27 IU/L (ref 0–40)
Albumin: 4.5 g/dL (ref 3.5–5.5)
Alkaline Phosphatase: 106 IU/L (ref 39–117)
BILIRUBIN TOTAL: 0.7 mg/dL (ref 0.0–1.2)
BILIRUBIN, DIRECT: 0.2 mg/dL (ref 0.00–0.40)
TOTAL PROTEIN: 6.2 g/dL (ref 6.0–8.5)

## 2017-12-15 LAB — LIPID PANEL
CHOL/HDL RATIO: 3.5 ratio (ref 0.0–5.0)
Cholesterol, Total: 156 mg/dL (ref 100–199)
HDL: 45 mg/dL (ref >39)
LDL Calculated: 91 mg/dL (ref 0–99)
Triglycerides: 101 mg/dL (ref 0–149)
VLDL Cholesterol Cal: 20 mg/dL (ref 5–40)

## 2017-12-16 ENCOUNTER — Encounter: Payer: Self-pay | Admitting: Cardiovascular Disease

## 2017-12-16 ENCOUNTER — Ambulatory Visit: Payer: BLUE CROSS/BLUE SHIELD | Admitting: Cardiovascular Disease

## 2017-12-16 VITALS — BP 120/78 | HR 65 | Ht 71.0 in | Wt 207.8 lb

## 2017-12-16 DIAGNOSIS — I2510 Atherosclerotic heart disease of native coronary artery without angina pectoris: Secondary | ICD-10-CM | POA: Diagnosis not present

## 2017-12-16 DIAGNOSIS — E782 Mixed hyperlipidemia: Secondary | ICD-10-CM | POA: Diagnosis not present

## 2017-12-16 NOTE — Patient Instructions (Addendum)
Medication Instructions:  Your physician recommends that you continue on your current medications as directed. Please refer to the Current Medication list given to you today.   Labwork: None Ordered   Testing/Procedures: Cardiac CT scanning for Calcium score, (CAT scanning), is a noninvasive, special x-ray that produces cross-sectional images of the body using x-rays and a computer. CT scans help physicians diagnose and treat medical conditions. For some CT exams, a contrast material is used to enhance visibility in the area of the body being studied. CT scans provide greater clarity and reveal more details than regular x-ray exams.   Follow-Up: Your physician wants you to follow-up in: 1 year with Dr. Elease HashimotoNahser. You will receive a reminder letter in the mail two months in advance. If you don't receive a letter, please call our office to schedule the follow-up appointment.   If you need a refill on your cardiac medications before your next appointment, please call your pharmacy.   Thank you for choosing [facility]! Eligha BridegroomMichelle Suzzette Gasparro, RN [phone]

## 2017-12-16 NOTE — Progress Notes (Signed)
Louis Delgado Date of Birth  Oct 10, 1960       [facility]    [place] [address]  [address] [place], [place]  [address]   [place], [place]  [address] [phone]     [phone]   Fax  [phone]    Fax [phone]  Problem List: 1. Hypertension 2. Hyperlipidemia 3.  Aortic insufficiency   History of Present Illness: Louis Delgado is a 57 y.o. gentleman with a hx of a  aortic insufficiency, hypertension, and hyperlipidemia. He's done well from a cardiac standpoint. He's not had any episodes of chest pain or shortness of breath. He has not been exercising quite as much as he would like but is looking forward to getting back into his exercise regimen.  He has not been getting as much exercise that he would like and he has gained some weight.   He has gained about 5 pounds.   He is otherwise feeling well.  Feb. 27, 2014 Louis Delgado is doing well from a cardiac standpoint.  He has been having some tingling and numbness in his fingers and toes.    It typically resolved if he moves his hands around.  No CP or dyspnea.  November 27, 2012:  Louis Delgado is doing well. He's having any episodes of chest pain or shortness breath. He has bicuspid aortic valve/aortic insufficiency that we have been following. He's not having any symptoms related to the aortic insufficiency. His blood pressure and heart rate at been well-controlled.  Feb. 11, 2015:  Louis Delgado is doing OK.   His TEE showed moderate AI but a 3 leaflet aortic valve.    Getting out and exercising.   His recent labs showed good lipid levels.   October 18, 2014:  Louis Delgado is seen back today for follow up of his aortic insufficiency . Has been seen in the lipid clinic - lipids have been ok Has had a detached retina and has not been exercising as much this past spring. No CP , no dyspnea  August 05, 2015  Louis Delgado is seen back today  Doing well Had an episode of blepharitis  Thinks that he may need another sleep  study. Snores loudly , Had a sleep study 7-8 years ago - had borderline OSA at that time.  More fatigued recently.   Has an AM headache frequently  Exercising several times a week .   Sep 06, 2016:  Louis Delgado is seen today for follow up  Overall feels well Exercising several times a week .     December 16, 2017: Louis Delgado  seen today for follow-up visit.  He has a history of hypertension and mild aortic insufficiency. Feeling well Had a kidney stone this past summer  Has lithotripsy  CT scan during his work up showed coronary calcifications.  He remains asymptomatic and has not had any episodes of chest pain. He is on crestor 5 mg just 4 times a week LDL was 91 yesterday   Noted that 1 of his EKGs revealed nonspecific interventricular conduction delay. Gust the benign nature of this.   Current Outpatient Medications on File Prior to Visit  Medication Sig Dispense Refill  . aspirin 81 MG tablet Take 81 mg by mouth at bedtime.     . cholecalciferol (VITAMIN D) 1000 UNITS tablet Take 1,000 Units by mouth daily.     . Doxycycline Hyclate 50 MG TABS Take 50 mg by mouth daily.     Marland Kitchen  metoprolol tartrate (LOPRESSOR) 25 MG tablet Take 1 tablet (25 mg total) by mouth 2 (two) times daily. 180 tablet 3  . Omega-3 Fatty Acids (FISH OIL) 1000 MG CPDR Take 2,000 mg by mouth 2 (two) times daily.    . rosuvastatin (CRESTOR) 5 MG tablet Take 5 mg by mouth 4 (four) times a week.     No current facility-administered medications on file prior to visit.     Allergies  Allergen Reactions  . Crestor [Rosuvastatin]     Possible memory issue when taking Crestor daily at high dose. Patient is taking crestor is still taking daily.  Marland Kitchen. Hctz [Hydrochlorothiazide] Nausea Only    Past Medical History:  Diagnosis Date  . Bicuspid aortic valve    moderate aortic isufficiency  . Chest pain   . Gastroesophageal reflux disease   . Hiatal hernia   . History of kidney stones   . Hypercholesterolemia   . Hyperlipidemia    . Hypertension   . Kidney stones   . Left ventricular hypertrophy    mild    Past Surgical History:  Procedure Laterality Date  . CARDIOVASCULAR STRESS TEST  11-23-2005   EF 55%  . CATARACT EXTRACTION Bilateral November & December 2013  . EXTRACORPOREAL SHOCK WAVE LITHOTRIPSY Left 09/19/2017   Procedure: LEFT EXTRACORPOREAL SHOCK WAVE LITHOTRIPSY (ESWL);  Surgeon: Crista ElliotBell, Eugene D III, MD;  Location: [facility];  Service: Urology;  Laterality: Left;  . RETINAL DETACHMENT SURGERY Left 2016  . TEE WITHOUT CARDIOVERSION N/A 01/11/2013   Procedure: TRANSESOPHAGEAL ECHOCARDIOGRAM (TEE);  Surgeon: Vesta MixerPhilip J Darcee Dekker, MD;  [facility];  Service: Cardiovascular;  Laterality: N/A;  . US ECHOCARDIOGRAPHY  06-23-2009   Est EF 55-60%    Social History   Tobacco Use  Smoking Status Never Smoker  Smokeless Tobacco Never Used    Social History   Substance and Sexual Activity  Alcohol Use No    Family History  Problem Relation Age of Onset  . Dementia Father   . Hypertension Unknown     Reviw of Systems:  Reviewed in the HPI.  All other systems are negative.  Physical Exam: Blood pressure 120/78, pulse 65, height 5\' 11"  (1.803 m), weight 207 lb 12.8 oz (94.3 kg), SpO2 94 %.  GEN:  Well nourished, well developed in no acute distress HEENT: Normal NECK: No JVD; No carotid bruits LYMPHATICS: No lymphadenopathy CARDIAC: RRR , no murmurs, rubs, gallops RESPIRATORY:  Clear to auscultation without rales, wheezing or rhonchi  ABDOMEN: Soft, non-tender, non-distended MUSCULOSKELETAL:  No edema; No deformity  SKIN: Warm and dry NEUROLOGIC:  Alert and oriented x 3    ECG:      Assessment / Plan:   1. Hypertension -  BP is well controlled.   2. Hyperlipidemia -LDL is 91.  Continue Crestor 4 times a week.  We will get a coronary calcium score.  If his coronary calcium score is above 100 with we may need to be more aggressive with our lipid lowering.  We also may consider stress test  although he is fairly asymptomatic at this point.  3.  Aortic insufficiency -    Will see him in 1 year.    Kristeen MissPhilip Daichi Moris, MD  12/16/2017 4:23 PM    [facility] [address]  [address] [place], [place]  [address] Pager [phone] Phone: [phone]; Fax: [phone]

## 2017-12-29 ENCOUNTER — Ambulatory Visit (INDEPENDENT_AMBULATORY_CARE_PROVIDER_SITE_OTHER)
Admission: RE | Admit: 2017-12-29 | Discharge: 2017-12-29 | Disposition: A | Discharge status: Home / Self Care | Payer: BLUE CROSS/BLUE SHIELD | Source: Ambulatory Visit | Attending: Cardiovascular Disease | Admitting: Cardiovascular Disease

## 2017-12-29 DIAGNOSIS — I2510 Atherosclerotic heart disease of native coronary artery without angina pectoris: Secondary | ICD-10-CM

## 2017-12-29 DIAGNOSIS — E782 Mixed hyperlipidemia: Secondary | ICD-10-CM

## 2018-01-02 ENCOUNTER — Telehealth: Payer: Self-pay | Admitting: Nurse Practitioner

## 2018-01-02 DIAGNOSIS — I77810 Thoracic aortic ectasia: Secondary | ICD-10-CM

## 2018-01-02 DIAGNOSIS — E782 Mixed hyperlipidemia: Secondary | ICD-10-CM

## 2018-01-02 MED ORDER — ROSUVASTATIN CALCIUM 5 MG PO TABS: 5.0000 mg | ORAL_TABLET | Freq: Every day | ORAL | 3 refills | Status: DC

## 2018-01-02 NOTE — Telephone Encounter (Signed)
Left message for patient to call back regarding test results and plan of care

## 2018-01-02 NOTE — Telephone Encounter (Signed)
-----   Message from Vesta MixerPhilip J Nahser, MD sent at 12/30/2017  9:51 AM EDT ----- Aorta is very mildly dilated .   Please order a CT angiogram of the chest to evaluate the entire aorta  Coronary calcium score is 210  Also please increase his Crestor to 5 mg a day ( from 5 mg every other day )  Check lipids, liver enz, BMP in 3 months His LDL goal is < 70

## 2018-01-02 NOTE — Telephone Encounter (Signed)
Reviewed test results and plan of care with patient who verbalized understanding. He agrees to return for CT of aorta. He will call back if he has any problems with the increase of Rosuvastatin to 5 mg daily. I scheduled him for repeat lab appointment on 12/17. He is aware someone will call to schedule CT. He thanked me for the call.

## 2018-01-10 ENCOUNTER — Ambulatory Visit (INDEPENDENT_AMBULATORY_CARE_PROVIDER_SITE_OTHER)
Admission: RE | Admit: 2018-01-10 | Discharge: 2018-01-10 | Disposition: A | Discharge status: Home / Self Care | Payer: BLUE CROSS/BLUE SHIELD | Source: Ambulatory Visit | Attending: Cardiovascular Disease | Admitting: Cardiovascular Disease

## 2018-01-10 DIAGNOSIS — I700 Atherosclerosis of aorta: Secondary | ICD-10-CM | POA: Diagnosis not present

## 2018-01-10 DIAGNOSIS — I77810 Thoracic aortic ectasia: Secondary | ICD-10-CM | POA: Diagnosis not present

## 2018-01-10 MED ORDER — IOPAMIDOL (ISOVUE-370) INJECTION 76%
100.0000 mL | Freq: Once | INTRAVENOUS | Status: AC | PRN
  Administered 2018-01-10: 100 mL via INTRAVENOUS

## 2018-01-12 ENCOUNTER — Telehealth: Payer: Self-pay | Admitting: Nurse Practitioner

## 2018-01-12 DIAGNOSIS — I712 Thoracic aortic aneurysm, without rupture, unspecified: Secondary | ICD-10-CM

## 2018-01-12 DIAGNOSIS — I351 Nonrheumatic aortic (valve) insufficiency: Secondary | ICD-10-CM

## 2018-01-12 NOTE — Telephone Encounter (Signed)
Reviewed CT results with patient who verbalized understanding. I answered questions to his satisfaction. He asks when he should have repeat echo for aortic insufficiency and we discussed the connection between the aortic valve and the thoracic aneurysm. I advised that I will discuss future testing dates with Dr. Elease Hashimoto and that our schedulers will contact him for appointments. He verbalized agreement with plan and thanked me for the call.

## 2018-01-12 NOTE — Telephone Encounter (Signed)
-----   Message from Vesta Mixer, MD sent at 01/11/2018  1:56 PM EDT ----- Stable aortic aneurism

## 2018-01-16 ENCOUNTER — Other Ambulatory Visit: Payer: Self-pay

## 2018-01-16 ENCOUNTER — Ambulatory Visit (HOSPITAL_COMMUNITY): Payer: BLUE CROSS/BLUE SHIELD | Attending: Cardiology

## 2018-01-16 DIAGNOSIS — I712 Thoracic aortic aneurysm, without rupture, unspecified: Secondary | ICD-10-CM

## 2018-01-16 DIAGNOSIS — Q231 Congenital insufficiency of aortic valve: Secondary | ICD-10-CM | POA: Insufficient documentation

## 2018-01-16 DIAGNOSIS — I10 Essential (primary) hypertension: Secondary | ICD-10-CM | POA: Diagnosis not present

## 2018-01-16 DIAGNOSIS — E785 Hyperlipidemia, unspecified: Secondary | ICD-10-CM | POA: Insufficient documentation

## 2018-01-16 DIAGNOSIS — I351 Nonrheumatic aortic (valve) insufficiency: Secondary | ICD-10-CM | POA: Diagnosis not present

## 2018-03-23 DIAGNOSIS — H43392 Other vitreous opacities, left eye: Secondary | ICD-10-CM | POA: Diagnosis not present

## 2018-03-23 DIAGNOSIS — Z961 Presence of intraocular lens: Secondary | ICD-10-CM | POA: Diagnosis not present

## 2018-03-23 DIAGNOSIS — H43813 Vitreous degeneration, bilateral: Secondary | ICD-10-CM | POA: Diagnosis not present

## 2018-03-23 DIAGNOSIS — H3322 Serous retinal detachment, left eye: Secondary | ICD-10-CM | POA: Diagnosis not present

## 2018-04-04 ENCOUNTER — Other Ambulatory Visit: Payer: BLUE CROSS/BLUE SHIELD

## 2018-04-04 DIAGNOSIS — I77810 Thoracic aortic ectasia: Secondary | ICD-10-CM

## 2018-04-04 DIAGNOSIS — E782 Mixed hyperlipidemia: Secondary | ICD-10-CM | POA: Diagnosis not present

## 2018-04-04 LAB — BASIC METABOLIC PANEL
BUN/Creatinine Ratio: 15 (ref 9–20)
BUN: 16 mg/dL (ref 6–24)
CO2: 24 mmol/L (ref 20–29)
CREATININE: 1.08 mg/dL (ref 0.76–1.27)
Calcium: 9.5 mg/dL (ref 8.7–10.2)
Chloride: 105 mmol/L (ref 96–106)
GFR calc non Af Amer: 76 mL/min/{1.73_m2} (ref >59)
GFR, EST AFRICAN AMERICAN: 88 mL/min/{1.73_m2} (ref >59)
Glucose: 97 mg/dL (ref 65–99)
Potassium: 5.3 mmol/L — ABNORMAL HIGH (ref 3.5–5.2)
Sodium: 144 mmol/L (ref 134–144)

## 2018-04-04 LAB — HEPATIC FUNCTION PANEL
ALK PHOS: 107 IU/L (ref 39–117)
ALT: 40 IU/L (ref 0–44)
AST: 30 IU/L (ref 0–40)
Albumin: 4.6 g/dL (ref 3.5–5.5)
BILIRUBIN TOTAL: 0.7 mg/dL (ref 0.0–1.2)
BILIRUBIN, DIRECT: 0.18 mg/dL (ref 0.00–0.40)
TOTAL PROTEIN: 6.4 g/dL (ref 6.0–8.5)

## 2018-04-04 LAB — LIPID PANEL
Chol/HDL Ratio: 2.8 ratio (ref 0.0–5.0)
Cholesterol, Total: 149 mg/dL (ref 100–199)
HDL: 53 mg/dL (ref >39)
LDL Calculated: 79 mg/dL (ref 0–99)
TRIGLYCERIDES: 83 mg/dL (ref 0–149)
VLDL Cholesterol Cal: 17 mg/dL (ref 5–40)

## 2018-04-24 ENCOUNTER — Other Ambulatory Visit: Payer: Self-pay | Admitting: Nurse Practitioner

## 2018-04-24 DIAGNOSIS — I351 Nonrheumatic aortic (valve) insufficiency: Secondary | ICD-10-CM

## 2018-07-03 ENCOUNTER — Encounter: Payer: Self-pay | Admitting: Cardiovascular Disease

## 2018-07-05 ENCOUNTER — Telehealth (HOSPITAL_COMMUNITY): Payer: Self-pay | Admitting: Radiology

## 2018-07-05 ENCOUNTER — Telehealth: Payer: Self-pay | Admitting: *Deleted

## 2018-07-05 NOTE — Telephone Encounter (Signed)
Ok per [place] to cancel appointment due to COVID 19 Restrictions. Will need to reschedule patient. Patient aware.

## 2018-07-05 NOTE — Telephone Encounter (Signed)
Spoke with patient, voiced understanding of cancelling echocardiogram due COVID 19 per Dr. Elease Hashimoto.

## 2018-07-06 ENCOUNTER — Telehealth: Payer: Self-pay

## 2018-07-06 ENCOUNTER — Inpatient Hospital Stay: Admission: RE | Admit: 2018-07-06 | Payer: BLUE CROSS/BLUE SHIELD | Source: Ambulatory Visit

## 2018-07-06 ENCOUNTER — Ambulatory Visit (HOSPITAL_COMMUNITY): Payer: BLUE CROSS/BLUE SHIELD

## 2018-07-06 NOTE — Telephone Encounter (Signed)
Left message for patient to call back regarding appointment with Dr. Nahser on 3/25. Call placed due to restrictions enacted for Covid 19.                 

## 2018-07-11 ENCOUNTER — Other Ambulatory Visit (HOSPITAL_COMMUNITY): Payer: BLUE CROSS/BLUE SHIELD

## 2018-07-11 ENCOUNTER — Other Ambulatory Visit: Payer: BLUE CROSS/BLUE SHIELD

## 2018-07-12 ENCOUNTER — Ambulatory Visit: Payer: BLUE CROSS/BLUE SHIELD | Admitting: Cardiovascular Disease

## 2018-07-13 ENCOUNTER — Inpatient Hospital Stay: Admission: RE | Admit: 2018-07-13 | Payer: BLUE CROSS/BLUE SHIELD | Source: Ambulatory Visit

## 2018-08-01 ENCOUNTER — Telehealth: Payer: Self-pay | Admitting: Cardiovascular Disease

## 2018-08-01 NOTE — Telephone Encounter (Signed)
Follow up   Patient would like a return call to speak to you.

## 2018-08-01 NOTE — Telephone Encounter (Signed)
Left message on patient's phone for him to call me back to reschedule his March appointment with Dr. Elease Hashimoto to [facility] this week.

## 2018-08-01 NOTE — Telephone Encounter (Signed)
Spoke with patient to discuss order of events for upcoming visit. Patient was scheduled to have an echo and a CT angio last month and then to follow-up with Dr. Elease Hashimoto. He is aware that all non-urgent testing has been delayed until restrictions are lifted due to Covid-19. He states he is feeling well but has concerns about waiting until August for an office visit and testing. I advised that we will schedule a virtual visit with Dr. Elease Hashimoto for soon and then testing can be ordered to be completed as soon as possible. Patient verbalized understanding and agreement. He has MyChart account and verbalized receipt of consent. He thanked me for the call.

## 2018-08-03 ENCOUNTER — Telehealth (INDEPENDENT_AMBULATORY_CARE_PROVIDER_SITE_OTHER): Payer: BLUE CROSS/BLUE SHIELD | Admitting: Cardiovascular Disease

## 2018-08-03 ENCOUNTER — Encounter: Payer: Self-pay | Admitting: Cardiovascular Disease

## 2018-08-03 ENCOUNTER — Other Ambulatory Visit: Payer: Self-pay

## 2018-08-03 VITALS — BP 140/90 | HR 53 | Ht 71.0 in | Wt 208.0 lb

## 2018-08-03 DIAGNOSIS — Z7189 Other specified counseling: Secondary | ICD-10-CM | POA: Diagnosis not present

## 2018-08-03 DIAGNOSIS — I712 Thoracic aortic aneurysm, without rupture, unspecified: Secondary | ICD-10-CM

## 2018-08-03 DIAGNOSIS — I10 Essential (primary) hypertension: Secondary | ICD-10-CM

## 2018-08-03 DIAGNOSIS — E782 Mixed hyperlipidemia: Secondary | ICD-10-CM

## 2018-08-03 DIAGNOSIS — I351 Nonrheumatic aortic (valve) insufficiency: Secondary | ICD-10-CM | POA: Diagnosis not present

## 2018-08-03 DIAGNOSIS — I2510 Atherosclerotic heart disease of native coronary artery without angina pectoris: Secondary | ICD-10-CM

## 2018-08-03 NOTE — Patient Instructions (Addendum)
Medication Instructions:  Your physician recommends that you continue on your current medications as directed. Please refer to the Current Medication list given to you today.  If you need a refill on your cardiac medications before your next appointment, please call your pharmacy.    Lab work: Your physician recommends that you return for lab work in: 6 months a few days before your office visit with Dr. Elease Hashimoto.  You will need to FAST for this appointment - nothing to eat or drink after midnight the night before except water.    Testing/Procedures: Non-Cardiac CT Angiography (CTA) is due in September 2020, is a special type of CT scan that uses a computer to produce multi-dimensional views of major blood vessels throughout the body. In CT angiography, a contrast material is injected through an IV to help visualize the blood vessels    HOW TO TAKE YOUR BLOOD PRESSURE:  Pick a time that is 1-2 hours after you have taken your medications  Rest 5 minutes before taking your blood pressure.   Don't smoke or drink caffeinated beverages for at least 30 minutes before.  Take your blood pressure before (not after) you eat or wait at least 30 minutes   Sit comfortably with your back supported and both feet on the floor (don't cross your legs).  Elevate your arm to heart level on a table or a desk.  Use the proper sized cuff. It should fit smoothly and snugly around your bare upper arm. There should be enough room to slip a fingertip under the cuff. The bottom edge of the cuff should be 1 inch above the crease of the elbow.  Follow-up: Your physician wants you to follow-up in: 6 months with Dr. Elease Hashimoto after your CT angiogram and lab work. You will receive a reminder letter in the mail two months in advance. If you don't receive a letter, please call our office to schedule the follow-up appointment.

## 2018-08-03 NOTE — Progress Notes (Signed)
Virtual Visit via Video Note   This visit type was conducted due to national recommendations for restrictions regarding the COVID-19 Pandemic (e.g. social distancing) in an effort to limit this patient's exposure and mitigate transmission in our community.  Due to his co-morbid illnesses, this patient is at least at moderate risk for complications without adequate follow up.  This format is felt to be most appropriate for this patient at this time.  All issues noted in this document were discussed and addressed.  A limited physical exam was performed with this format.  Please refer to the patient's chart for his consent to telehealth for [facility].   Evaluation Performed:  Follow-up visit  Date:  08/03/2018   ID:  Louis Delgado, DOB 08/08/1960, MRN [id number]  Patient Location: Home Provider Location: Home  PCP:  Darrin Nipperollege, [facility] [place]  Cardiologist:  No primary care provider on file.  Electrophysiologist:  None   Chief Complaint:  Follow up aortic dilation   History of Present Illness:    Louis FredricksonRaymond Tyrus Delgado is a 58 y.o. male with aortic insufficiency and mild aortic dilatation.  We are seeing him today by virtual visit for follow-up of his aortic insufficiency and mild aortic dilatation.  We performed a coronary calcium score on December 29, 2017.  His coronary calcium score is 210 94th percentile for age and sex matched controls.  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  They had recommended annual follow-up.   Walks in the neighborhood 3-4 miles twice a week. Weight has not changed. Does not regularly check his bp but its mildly elevated today  ( 140/90) today   No cp, no dyspnea, No syncope No diaphorisis   No fever, cough, no aches Social distancing,  Wears a mask when he goes out  The patient does not have symptoms concerning for COVID-19 infection (fever, chills, cough, or  new shortness of breath).    Past Medical History:  Diagnosis Date  . Bicuspid aortic valve    moderate aortic isufficiency  . Chest pain   . Gastroesophageal reflux disease   . Hiatal hernia   . History of kidney stones   . Hypercholesterolemia   . Hyperlipidemia   . Hypertension   . Kidney stones   . Left ventricular hypertrophy    mild   Past Surgical History:  Procedure Laterality Date  . CARDIOVASCULAR STRESS TEST  11-23-2005   EF 55%  . CATARACT EXTRACTION Bilateral November & December 2013  . EXTRACORPOREAL SHOCK WAVE LITHOTRIPSY Left 09/19/2017   Procedure: LEFT EXTRACORPOREAL SHOCK WAVE LITHOTRIPSY (ESWL);  Surgeon: Crista ElliotBell, Eugene D III, MD;  Location: [facility];  Service: Urology;  Laterality: Left;  . RETINAL DETACHMENT SURGERY Left 2016  . TEE WITHOUT CARDIOVERSION N/A 01/11/2013   Procedure: TRANSESOPHAGEAL ECHOCARDIOGRAM (TEE);  Surgeon: Vesta MixerPhilip J Nahser, MD;  [facility];  Service: Cardiovascular;  Laterality: N/A;  . US ECHOCARDIOGRAPHY  06-23-2009   Est EF 55-60%     Current Meds  Medication Sig  . aspirin 81 MG tablet Take 81 mg by mouth at bedtime.   . Doxycycline Hyclate 50 MG TABS Take 50 mg by mouth daily.   . metoprolol tartrate (LOPRESSOR) 25 MG tablet Take 1 tablet (25 mg total) by mouth 2 (two) times daily.  . rosuvastatin (CRESTOR) 5 MG tablet Take 1 tablet (5 mg total) by mouth daily.     Allergies:   Crestor [  rosuvastatin] and Hydrochlorothiazide   Social History   Tobacco Use  . Smoking status: Never Smoker  . Smokeless tobacco: Never Used  Substance Use Topics  . Alcohol use: No  . Drug use: No     Family Hx: The patient's family history includes Dementia in his father; Hypertension in an other family member.  ROS:   Please see the history of present illness.     All other systems reviewed and are negative.   Prior CV studies:   The following studies were reviewed today:    Labs/Other Tests and Data Reviewed:    EKG:  An  ECG dated  Sep 16, 2017  was personally reviewed today and demonstrated:   sinus brady at 58.  inc. RBBB   Recent Labs: 09/07/2017: Hemoglobin 16.4; Platelets 240 04/04/2018: ALT 40; BUN 16; Creatinine, Ser 1.08; Potassium 5.3; Sodium 144   Recent Lipid Panel Lab Results  Component Value Date/Time   CHOL 149 04/04/2018 08:15 AM   TRIG 83 04/04/2018 08:15 AM   HDL 53 04/04/2018 08:15 AM   CHOLHDL 2.8 04/04/2018 08:15 AM   CHOLHDL 3.5 08/05/2015 09:56 AM   LDLCALC 79 04/04/2018 08:15 AM    Wt Readings from Last 3 Encounters:  08/03/18 208 lb (94.3 kg)  12/16/17 207 lb 12.8 oz (94.3 kg)  09/19/17 210 lb (95.3 kg)     Objective:    Vital Signs:  BP 140/90 (BP Location: Left Arm, Patient Position: Sitting, Cuff Size: Normal)   Pulse (!) 53   Ht  (1.803 m)   Wt 208 lb (94.3 kg)   BMI 29.01 kg/m    General:   Appears healthy,   NAD HEENT:   No obvious JVD or lymphadenopathy Resp:   Normal work of breathing,   resp rate is normal  CV :   BP and HR are normal ,  No edema Abd:   No abdomina swelling , Ext:   No clubbing, cyanosis, or edema  Neuro:   Alert and oriented x 3.   No obvious motor deficits Skin : no obvious rashes    ASSESSMENT & PLAN:    1. Asc. Aorta - aortic dilatation is stable. Ct angio of chest to be done the week before  2. HTN: BP is a little elevated.    He is eating more salt than usual. He will decrease the salt in his diet.   He will keep a BP log.   Will send in readings via mychart.  3.  Aortic insufficiency: His last echocardiogram showed mild aortic insufficiency.  This is stable.  No need to repeat echo at this point.  Will consider again on in several years.  4.  Hyperlipidemia: We will plan on checking lipids, liver enzymes, basic metabolic profile in 6 months.  His last labs in December looked great.  He has a coronary calcium score of 210 which places him in the 97th percentile of age/sex matched controls.  We will continue with an  aggressive approach at cholesterol lowering.   Will see him in 6 months . Ct angio of chest to be done the week before  Lipids, liver, BMP the week before ov  Echo is stable,  Mild AI - no need to repeat yet    COVID-19 Education: The signs and symptoms of COVID-19 were discussed with the patient and how to seek care for testing (follow up with PCP or arrange E-visit).  The importance of social distancing was discussed today.  Time:   Today, I have spent  17  minutes with the patient with telehealth technology discussing the above problems.  Additional 12 minutes of charting / precharting    Medication Adjustments/Labs and Tests Ordered: Current medicines are reviewed at length with the patient today.  Concerns regarding medicines are outlined above.   Tests Ordered: No orders of the defined types were placed in this encounter.   Medication Changes: No orders of the defined types were placed in this encounter.   Disposition:  Follow up in 6 month(s)  Signed, Kristeen Miss, MD  08/03/2018 11:36 AM    [facility]

## 2018-08-04 ENCOUNTER — Telehealth: Payer: Self-pay | Admitting: Cardiovascular Disease

## 2018-08-04 NOTE — Telephone Encounter (Signed)
I spoke to the patient who is calling because his BP this morning 2 hours after taking his morning dose of Metoprolol Tartrate 25 mg was 160/100, HR 55 bpm.    He had a virtual visit on 4/16 and BP was discussed and at the time of the visit, it was 140/90.  He was told to keep [place] updated with readings.  He is overall asymptomatic, but does mention infrequent headaches.  He mentioned that before yesterday checking his BP, he had not checked it in a month.  He will continue to monitor over the weekend and either he will call with an update or we can reach out to him.

## 2018-08-04 NOTE — Telephone Encounter (Signed)
Pt c/o BP issue: STAT if pt c/o blurred vision, one-sided weakness or slurred speech  1. What are your last 5 BP readings? 160/100  2. Are you having any other symptoms (ex. Dizziness, headache, blurred vision, passed out)? No   3. What is your BP issue? Patient had a appt yesterday.

## 2018-09-06 ENCOUNTER — Other Ambulatory Visit: Payer: Self-pay

## 2018-09-06 ENCOUNTER — Other Ambulatory Visit: Payer: BLUE CROSS/BLUE SHIELD | Admitting: *Deleted

## 2018-09-06 DIAGNOSIS — E782 Mixed hyperlipidemia: Secondary | ICD-10-CM | POA: Diagnosis not present

## 2018-09-06 DIAGNOSIS — I351 Nonrheumatic aortic (valve) insufficiency: Secondary | ICD-10-CM

## 2018-09-06 DIAGNOSIS — I712 Thoracic aortic aneurysm, without rupture, unspecified: Secondary | ICD-10-CM

## 2018-09-06 DIAGNOSIS — I2510 Atherosclerotic heart disease of native coronary artery without angina pectoris: Secondary | ICD-10-CM

## 2018-09-06 LAB — BASIC METABOLIC PANEL
BUN/Creatinine Ratio: 17 (ref 9–20)
BUN: 17 mg/dL (ref 6–24)
CO2: 25 mmol/L (ref 20–29)
Calcium: 9.5 mg/dL (ref 8.7–10.2)
Chloride: 102 mmol/L (ref 96–106)
Creatinine, Ser: 1.02 mg/dL (ref 0.76–1.27)
GFR calc Af Amer: 93 mL/min/{1.73_m2} (ref >59)
GFR calc non Af Amer: 81 mL/min/{1.73_m2} (ref >59)
Glucose: 91 mg/dL (ref 65–99)
Potassium: 4.2 mmol/L (ref 3.5–5.2)
Sodium: 142 mmol/L (ref 134–144)

## 2018-09-06 LAB — HEPATIC FUNCTION PANEL
ALT: 40 IU/L (ref 0–44)
AST: 34 IU/L (ref 0–40)
Albumin: 4.5 g/dL (ref 3.8–4.9)
Alkaline Phosphatase: 112 IU/L (ref 39–117)
Bilirubin Total: 0.7 mg/dL (ref 0.0–1.2)
Bilirubin, Direct: 0.19 mg/dL (ref 0.00–0.40)
Total Protein: 6.5 g/dL (ref 6.0–8.5)

## 2018-09-06 LAB — LIPID PANEL
Chol/HDL Ratio: 3.3 ratio (ref 0.0–5.0)
Cholesterol, Total: 153 mg/dL (ref 100–199)
HDL: 47 mg/dL (ref >39)
LDL Calculated: 82 mg/dL (ref 0–99)
Triglycerides: 121 mg/dL (ref 0–149)
VLDL Cholesterol Cal: 24 mg/dL (ref 5–40)

## 2018-09-07 ENCOUNTER — Telehealth: Payer: Self-pay | Admitting: *Deleted

## 2018-09-07 NOTE — Telephone Encounter (Signed)

## 2018-09-08 ENCOUNTER — Telehealth (HOSPITAL_COMMUNITY): Payer: Self-pay | Admitting: Radiology

## 2018-09-08 ENCOUNTER — Other Ambulatory Visit: Payer: Self-pay

## 2018-09-08 ENCOUNTER — Ambulatory Visit (INDEPENDENT_AMBULATORY_CARE_PROVIDER_SITE_OTHER)
Admission: RE | Admit: 2018-09-08 | Discharge: 2018-09-08 | Disposition: A | Discharge status: Home / Self Care | Payer: BLUE CROSS/BLUE SHIELD | Source: Ambulatory Visit | Attending: Cardiovascular Disease | Admitting: Cardiovascular Disease

## 2018-09-08 ENCOUNTER — Other Ambulatory Visit: Payer: BLUE CROSS/BLUE SHIELD

## 2018-09-08 DIAGNOSIS — I712 Thoracic aortic aneurysm, without rupture, unspecified: Secondary | ICD-10-CM

## 2018-09-08 MED ORDER — IOHEXOL 350 MG/ML SOLN
100.0000 mL | Freq: Once | INTRAVENOUS | Status: AC | PRN
  Administered 2018-09-08: 100 mL via INTRAVENOUS

## 2018-09-08 NOTE — Telephone Encounter (Signed)

## 2018-09-12 ENCOUNTER — Ambulatory Visit (HOSPITAL_COMMUNITY): Payer: BLUE CROSS/BLUE SHIELD | Attending: Cardiovascular Disease

## 2018-09-12 ENCOUNTER — Other Ambulatory Visit: Payer: Self-pay

## 2018-09-12 DIAGNOSIS — I351 Nonrheumatic aortic (valve) insufficiency: Secondary | ICD-10-CM | POA: Diagnosis not present

## 2018-11-20 ENCOUNTER — Other Ambulatory Visit: Payer: Self-pay | Admitting: Cardiovascular Disease

## 2018-11-21 DIAGNOSIS — Z7189 Other specified counseling: Secondary | ICD-10-CM | POA: Diagnosis not present

## 2018-11-21 DIAGNOSIS — Z20828 Contact with and (suspected) exposure to other viral communicable diseases: Secondary | ICD-10-CM | POA: Diagnosis not present

## 2019-01-23 DIAGNOSIS — Z03818 Encounter for observation for suspected exposure to other biological agents ruled out: Secondary | ICD-10-CM | POA: Diagnosis not present

## 2019-01-23 DIAGNOSIS — Z20828 Contact with and (suspected) exposure to other viral communicable diseases: Secondary | ICD-10-CM | POA: Diagnosis not present

## 2019-02-22 ENCOUNTER — Other Ambulatory Visit: Payer: Self-pay | Admitting: Cardiovascular Disease

## 2019-02-23 ENCOUNTER — Other Ambulatory Visit: Payer: Self-pay

## 2019-02-23 ENCOUNTER — Telehealth: Payer: Self-pay | Admitting: Cardiovascular Disease

## 2019-02-23 MED ORDER — ROSUVASTATIN CALCIUM 5 MG PO TABS: 5.0000 mg | ORAL_TABLET | Freq: Every day | ORAL | 0 refills | Status: DC

## 2019-02-23 NOTE — Telephone Encounter (Signed)
 *  STAT* If patient is at the pharmacy, call can be transferred to refill team.   1. Which medications need to be refilled? (please list name of each medication and dose if known) rosuvastatin (CRESTOR) 5 MG tablet 2. Which pharmacy/location (including street and city if local pharmacy) is medication to be sent to? [facility]  3. Do they need a 30 day or 90 day supply? 90  Patient has one day left

## 2019-05-16 ENCOUNTER — Other Ambulatory Visit: Payer: Self-pay | Admitting: Cardiovascular Disease

## 2019-06-07 ENCOUNTER — Other Ambulatory Visit: Payer: Self-pay | Admitting: Nurse Practitioner

## 2019-06-07 DIAGNOSIS — I712 Thoracic aortic aneurysm, without rupture, unspecified: Secondary | ICD-10-CM

## 2019-06-07 DIAGNOSIS — I2510 Atherosclerotic heart disease of native coronary artery without angina pectoris: Secondary | ICD-10-CM

## 2019-06-07 DIAGNOSIS — I10 Essential (primary) hypertension: Secondary | ICD-10-CM

## 2019-06-07 DIAGNOSIS — I351 Nonrheumatic aortic (valve) insufficiency: Secondary | ICD-10-CM

## 2019-06-07 DIAGNOSIS — E782 Mixed hyperlipidemia: Secondary | ICD-10-CM

## 2019-06-25 ENCOUNTER — Other Ambulatory Visit: Payer: Self-pay

## 2019-06-25 ENCOUNTER — Other Ambulatory Visit: Payer: BC Managed Care – PPO | Admitting: *Deleted

## 2019-06-25 DIAGNOSIS — I10 Essential (primary) hypertension: Secondary | ICD-10-CM | POA: Diagnosis not present

## 2019-06-25 DIAGNOSIS — E782 Mixed hyperlipidemia: Secondary | ICD-10-CM | POA: Diagnosis not present

## 2019-06-25 DIAGNOSIS — I712 Thoracic aortic aneurysm, without rupture, unspecified: Secondary | ICD-10-CM

## 2019-06-25 DIAGNOSIS — I2510 Atherosclerotic heart disease of native coronary artery without angina pectoris: Secondary | ICD-10-CM

## 2019-06-25 DIAGNOSIS — I351 Nonrheumatic aortic (valve) insufficiency: Secondary | ICD-10-CM | POA: Diagnosis not present

## 2019-06-25 LAB — BASIC METABOLIC PANEL
BUN/Creatinine Ratio: 18 (ref 9–20)
BUN: 18 mg/dL (ref 6–24)
CO2: 24 mmol/L (ref 20–29)
Calcium: 9.4 mg/dL (ref 8.7–10.2)
Chloride: 101 mmol/L (ref 96–106)
Creatinine, Ser: 1.01 mg/dL (ref 0.76–1.27)
GFR calc Af Amer: 94 mL/min/{1.73_m2} (ref >59)
GFR calc non Af Amer: 81 mL/min/{1.73_m2} (ref >59)
Glucose: 91 mg/dL (ref 65–99)
Potassium: 4.6 mmol/L (ref 3.5–5.2)
Sodium: 140 mmol/L (ref 134–144)

## 2019-06-25 LAB — HEPATIC FUNCTION PANEL
ALT: 50 IU/L — ABNORMAL HIGH (ref 0–44)
AST: 40 IU/L (ref 0–40)
Albumin: 4.4 g/dL (ref 3.8–4.9)
Alkaline Phosphatase: 130 IU/L — ABNORMAL HIGH (ref 39–117)
Bilirubin Total: 0.5 mg/dL (ref 0.0–1.2)
Bilirubin, Direct: 0.13 mg/dL (ref 0.00–0.40)
Total Protein: 6.3 g/dL (ref 6.0–8.5)

## 2019-06-25 LAB — LIPID PANEL
Chol/HDL Ratio: 2.8 ratio (ref 0.0–5.0)
Cholesterol, Total: 147 mg/dL (ref 100–199)
HDL: 52 mg/dL (ref >39)
LDL Chol Calc (NIH): 77 mg/dL (ref 0–99)
Triglycerides: 100 mg/dL (ref 0–149)
VLDL Cholesterol Cal: 18 mg/dL (ref 5–40)

## 2019-06-27 ENCOUNTER — Encounter: Payer: Self-pay | Admitting: Cardiovascular Disease

## 2019-06-27 ENCOUNTER — Other Ambulatory Visit: Payer: Self-pay

## 2019-06-27 ENCOUNTER — Ambulatory Visit: Payer: BC Managed Care – PPO | Admitting: Cardiovascular Disease

## 2019-06-27 VITALS — BP 110/70 | HR 53 | Ht 71.0 in | Wt 216.0 lb

## 2019-06-27 DIAGNOSIS — I712 Thoracic aortic aneurysm, without rupture, unspecified: Secondary | ICD-10-CM

## 2019-06-27 DIAGNOSIS — E782 Mixed hyperlipidemia: Secondary | ICD-10-CM

## 2019-06-27 DIAGNOSIS — I351 Nonrheumatic aortic (valve) insufficiency: Secondary | ICD-10-CM

## 2019-06-27 DIAGNOSIS — I10 Essential (primary) hypertension: Secondary | ICD-10-CM

## 2019-06-27 NOTE — Progress Notes (Signed)
Louis Delgado Date of Birth  [birth year]       [facility]    [place] [address]  [address], [address] [place], [place]  [address]   [place], [place]  [address] [phone]     [phone]   Fax  [phone]    Fax [phone]  Problem List: 1. Hypertension 2. Hyperlipidemia 3.  Aortic insufficiency   Previous notes Louis Delgado is a 59 y.o. gentleman with a hx of a  aortic insufficiency, hypertension, and hyperlipidemia. He's done well from a cardiac standpoint. He's not had any episodes of chest pain or shortness of breath. He has not been exercising quite as much as he would like but is looking forward to getting back into his exercise regimen.  He has not been getting as much exercise that he would like and he has gained some weight.   He has gained about 5 pounds.   He is otherwise feeling well.  Feb. 27, 2014 Louis Delgado is doing well from a cardiac standpoint.  He has been having some tingling and numbness in his fingers and toes.    It typically resolved if he moves his hands around.  No CP or dyspnea.  November 27, 2012:  Louis Delgado is doing well. He's having any episodes of chest pain or shortness breath. He has bicuspid aortic valve/aortic insufficiency that we have been following. He's not having any symptoms related to the aortic insufficiency. His blood pressure and heart rate at been well-controlled.  Feb. 11, 2015:  Louis Delgado is doing OK.   His TEE showed moderate AI but a 3 leaflet aortic valve.    Getting out and exercising.   His recent labs showed good lipid levels.   October 18, 2014:  Louis Delgado is seen back today for follow up of his aortic insufficiency . Has been seen in the lipid clinic - lipids have been ok Has had a detached retina and has not been exercising as much this past spring. No CP , no dyspnea  August 05, 2015  Louis Delgado is seen back today  Doing well Had an episode of blepharitis  Thinks that he may need another sleep study. Snores  loudly , Had a sleep study 7-8 years ago - had borderline OSA at that time.  More fatigued recently.   Has an AM headache frequently  Exercising several times a week .   Sep 06, 2016:  Louis Delgado is seen today for follow up  Overall feels well Exercising several times a week .     December 16, 2017: Louis Delgado  seen today for follow-up visit.  He has a history of hypertension and mild aortic insufficiency. Feeling well Had a kidney stone this past summer  Has lithotripsy  CT scan during his work up showed coronary calcifications.  He remains asymptomatic and has not had any episodes of chest pain. He is on crestor 5 mg just 4 times a week LDL was 91 yesterday   Noted that 1 of his EKGs revealed nonspecific interventricular conduction delay. We discussed the benign nature of this.  June 27, 2019:  Louis Delgado is seen today for follow-up of his coronary artery calcifications, hyperlipidemia , aortic insufficiency.  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  This mild dilatation has remained unchanged for years.  Recent labs look stable.  His basic metabolic profile reveals a creatinine of 1.01.  His  potassium is 4.6.  Total cholesterol is 147.  HDL is 52.  Triglyceride levels 100.  LDL 77.   Echocardiogram in May, 2020 reveals normal left ventricular systolic function.  He has mild (grade 1) diastolic dysfunction.  He has mild aortic insufficiency.  The ascending aorta was thought to be mildly dilated and measured 40 mm. Exercising some. No DOE .   No fatigue.  No cough No fever     Current Outpatient Medications on File Prior to Visit  Medication Sig Dispense Refill  . aspirin 81 MG tablet Take 81 mg by mouth at bedtime.     . metoprolol tartrate (LOPRESSOR) 25 MG tablet TAKE 1 TABLET BY MOUTH TWICE DAILY. GENERIC EQUIVALENT FOR LOPRESSOR 180 tablet 2  . rosuvastatin (CRESTOR) 5 MG tablet TAKE 1 TABLET BY MOUTH EVERY DAY 90 tablet 0   No  current facility-administered medications on file prior to visit.    Allergies  Allergen Reactions  . Crestor [Rosuvastatin]     Possible memory issue when taking Crestor daily at high dose. Patient is taking crestor is still taking daily.  . Hydrochlorothiazide Nausea Only and Other (See Comments)    Made him sick    Past Medical History:  Diagnosis Date  . Bicuspid aortic valve    moderate aortic isufficiency  . Chest pain   . Gastroesophageal reflux disease   . Hiatal hernia   . History of kidney stones   . Hypercholesterolemia   . Hyperlipidemia   . Hypertension   . Kidney stones   . Left ventricular hypertrophy    mild    Past Surgical History:  Procedure Laterality Date  . CARDIOVASCULAR STRESS TEST  11-23-2005   EF 55%  . CATARACT EXTRACTION Bilateral November & December 2013  . EXTRACORPOREAL SHOCK WAVE LITHOTRIPSY Left 09/19/2017   Procedure: LEFT EXTRACORPOREAL SHOCK WAVE LITHOTRIPSY (ESWL);  Surgeon: Louis Elliot, MD;  Location: [facility];  Service: Urology;  Laterality: Left;  . RETINAL DETACHMENT SURGERY Left 2016  . TEE WITHOUT CARDIOVERSION N/A 01/11/2013   Procedure: TRANSESOPHAGEAL ECHOCARDIOGRAM (TEE);  Surgeon: Louis Mixer, MD;  Location: [facility] END[facility];  Service: Cardiovascular;  Laterality: N/A;  . US ECHOCARDIOGRAPHY  06-23-2009   Est EF 55-60%    Social History   Tobacco Use  Smoking Status Never Smoker  Smokeless Tobacco Never Used    Social History   Substance and Sexual Activity  Alcohol Use No    Family History  Problem Relation Age of Onset  . Dementia Father   . Hypertension Other     Reviw of Systems:  Reviewed in the HPI.  All other systems are negative.  Physical Exam: Blood pressure 110/70, pulse (!) 53, height 5\' 11"  (1.803 m), weight 216 lb (98 kg), SpO2 98 %.  GEN:  Well nourished, well developed in no acute distress HEENT: Normal NECK: No JVD; No carotid bruits LYMPHATICS: No lymphadenopathy CARDIAC: RRR, soft  diastolic murmur  RESPIRATORY:  Clear to auscultation without rales, wheezing or rhonchi  ABDOMEN: Soft, non-tender, non-distended MUSCULOSKELETAL:  No edema; No deformity  SKIN: Warm and dry NEUROLOGIC:  Alert and oriented x 3   ECG:   June 27, 2019: Sinus bradycardia 53.  Incomplete right bundle branch block.  No changes from previous EKGs.  Assessment / Plan:   1. Hypertension -  BP is well controlled.   2. Hyperlipidemia -   Lipids look great .  3.  Aortic insufficiency -   his aortic insufficiency  was mild during his last echo in May 2020.  I think we can probably skip several years before his next echocardiogram.  I will see him again in 1 year and will discuss at that time.  4.  Mild aortic dilatation: He has minimal aortic dilatation with an aortic diameter of 38 x 38 mm.  The CT scans are unchanged from previous CT scans.  Will plan on getting another CT angio of the chest in approximately 3 years.  Mertie Moores, MD  06/27/2019 8:52 AM    [facility] [place],  [place], [place]  [address] Pager [phone] Phone: [phone]; Fax: [phone]

## 2019-06-27 NOTE — Patient Instructions (Signed)
Medication Instructions:  Your provider recommends that you continue on your current medications as directed. Please refer to the Current Medication list given to you today.   *If you need a refill on your cardiac medications before your next appointment, please call your pharmacy*  Lab Work: Please schedule FASTING labs a few days prior to your office visit in 1 year. If you have labs (blood work) drawn today and your tests are completely normal, you will receive your results only by: Marland Kitchen MyChart Message (if you have MyChart) OR . A paper copy in the mail If you have any lab test that is abnormal or we need to change your treatment, we will call you to review the results.  Follow-Up: At [facility], you and your health needs are our priority.  As part of our continuing mission to provide you with exceptional heart care, we have created designated Provider Care Teams.  These Care Teams include your primary Cardiologist (physician) and Advanced Practice Providers (APPs -  Physician Assistants and Nurse Practitioners) who all work together to provide you with the care you need, when you need it. Your next appointment:   12 month(s) The format for your next appointment:   In Person Provider:   You may see Dr. Elease Hashimoto or one of the following Advanced Practice Providers on your designated Care Team:    Tereso Newcomer, PA-C  [place], [place]

## 2019-06-29 ENCOUNTER — Ambulatory Visit: Payer: BC Managed Care – PPO | Attending: Internal Medicine

## 2019-06-29 DIAGNOSIS — Z23 Encounter for immunization: Secondary | ICD-10-CM

## 2019-06-29 NOTE — Progress Notes (Signed)
   Covid-19 Vaccination Clinic  Name:  Louis Delgado    MRN: [id number] DOB: [birth year]  06/29/2019  Louis Delgado was observed post Covid-19 immunization for 15 minutes without incident. He was provided with Vaccine Information Sheet and instruction to access the V-Safe system.   Louis Delgado was instructed to call 911 with any severe reactions post vaccine: Marland Kitchen Difficulty breathing  . Swelling of face and throat  . A fast heartbeat  . A bad rash all over body  . Dizziness and weakness   Immunizations Administered    Name Date Dose VIS Date Route   Pfizer COVID-19 Vaccine 06/29/2019 11:04 AM 0.3 mL 03/30/2019 Intramuscular   Manufacturer: [employer], [employer]   Lot: [id number]   NDC: [address]-7

## 2019-07-23 ENCOUNTER — Ambulatory Visit: Payer: BC Managed Care – PPO

## 2019-07-24 DIAGNOSIS — Z03818 Encounter for observation for suspected exposure to other biological agents ruled out: Secondary | ICD-10-CM | POA: Diagnosis not present

## 2019-07-31 ENCOUNTER — Ambulatory Visit: Payer: BC Managed Care – PPO | Attending: Internal Medicine

## 2019-07-31 DIAGNOSIS — Z23 Encounter for immunization: Secondary | ICD-10-CM

## 2019-07-31 NOTE — Progress Notes (Signed)
   Covid-19 Vaccination Clinic  Name:  Braeden Kennan    MRN: [id number] DOB: Oct 18, 1960  07/31/2019  Mr. Keidel was observed post Covid-19 immunization for 15 minutes without incident. He was provided with Vaccine Information Sheet and instruction to access the V-Safe system.   Mr. Makin was instructed to call 911 with any severe reactions post vaccine: Marland Kitchen Difficulty breathing  . Swelling of face and throat  . A fast heartbeat  . A bad rash all over body  . Dizziness and weakness   Immunizations Administered    Name Date Dose VIS Date Route   Pfizer COVID-19 Vaccine 07/31/2019  1:10 PM 0.3 mL 03/30/2019 Intramuscular   Manufacturer: [employer], [employer]   Lot: [id number]   NDC: [address]-8

## 2019-08-11 ENCOUNTER — Other Ambulatory Visit: Payer: Self-pay | Admitting: Cardiovascular Disease

## 2019-08-14 DIAGNOSIS — Z03818 Encounter for observation for suspected exposure to other biological agents ruled out: Secondary | ICD-10-CM | POA: Diagnosis not present

## 2019-08-28 ENCOUNTER — Other Ambulatory Visit: Payer: Self-pay

## 2019-08-28 MED ORDER — METOPROLOL TARTRATE 25 MG PO TABS: ORAL_TABLET | ORAL | 3 refills | Status: DC

## 2019-08-30 MED ORDER — METOPROLOL TARTRATE 25 MG PO TABS: ORAL_TABLET | ORAL | 3 refills | Status: DC

## 2019-08-30 NOTE — Addendum Note (Signed)
Addended by: Demetrios Loll on: 08/30/2019 10:11 AM   Modules accepted: Orders

## 2019-09-19 DIAGNOSIS — Z9889 Other specified postprocedural states: Secondary | ICD-10-CM | POA: Diagnosis not present

## 2019-09-19 DIAGNOSIS — H3322 Serous retinal detachment, left eye: Secondary | ICD-10-CM | POA: Diagnosis not present

## 2019-09-19 DIAGNOSIS — Z961 Presence of intraocular lens: Secondary | ICD-10-CM | POA: Diagnosis not present

## 2019-09-19 DIAGNOSIS — H43392 Other vitreous opacities, left eye: Secondary | ICD-10-CM | POA: Diagnosis not present

## 2019-10-23 IMAGING — CT CT ANGIO CHEST
2 of 7 series · 18 of 46 positions shown · IV contrast (iopamidol)
Comparison: 12/29/2017 and previous

CLINICAL DATA: Follow up aortic root dilation.

EXAM:
CT ANGIOGRAPHY CHEST WITH CONTRAST
TECHNIQUE: Multidetector CT imaging of the chest was performed using the
standard protocol during bolus administration of intravenous
contrast. Multiplanar CT image reconstructions and MIPs were
obtained to evaluate the vascular anatomy.
CONTRAST:  100mL [id number] IOPAMIDOL ([id number]) INJECTION 76%

[Series 4: aorta 3.0 i31f 2 · axial · 0.75mm/px · z∈[-349,-22]mm · 15 of 119 slices shown]
[im 5/119  lung]
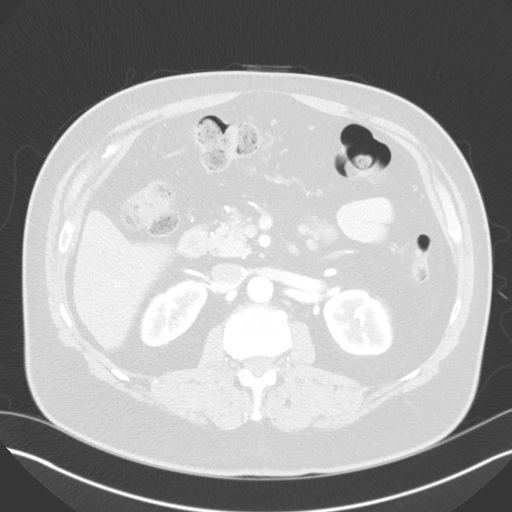
[im 14/119  soft-tissue]
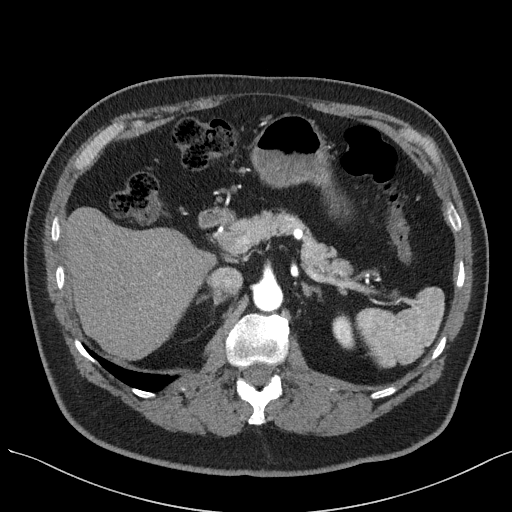
[im 22/119  lung]
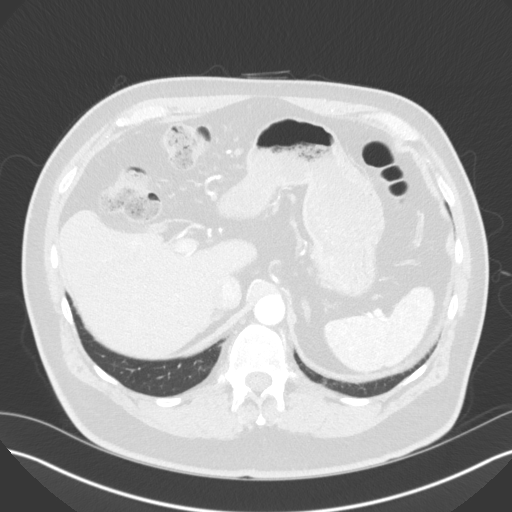
[im 31/119  soft-tissue]
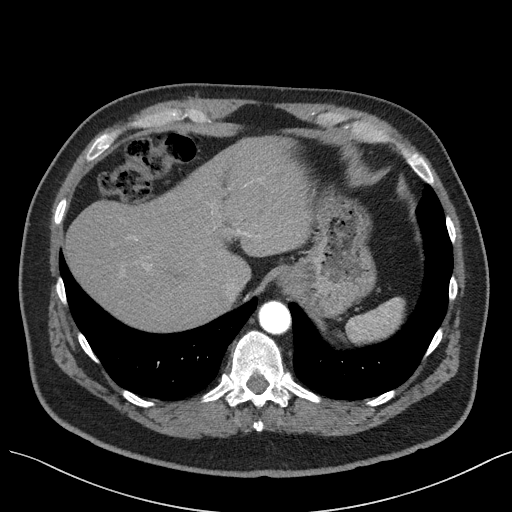
[im 35/119  lung]
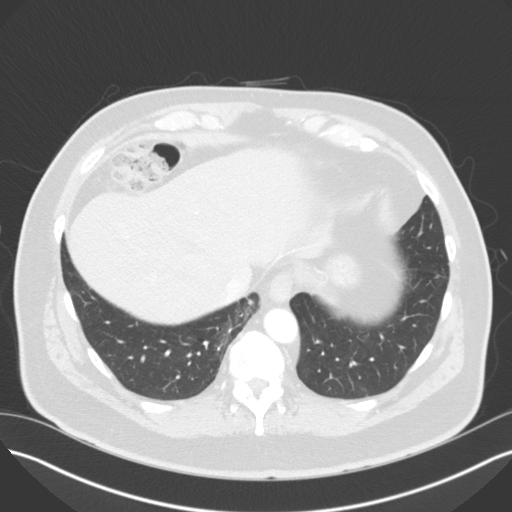
[im 44/119  soft-tissue]
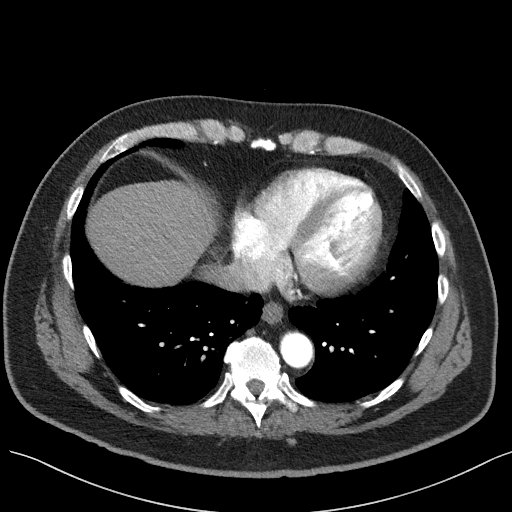
[im 53/119  lung]
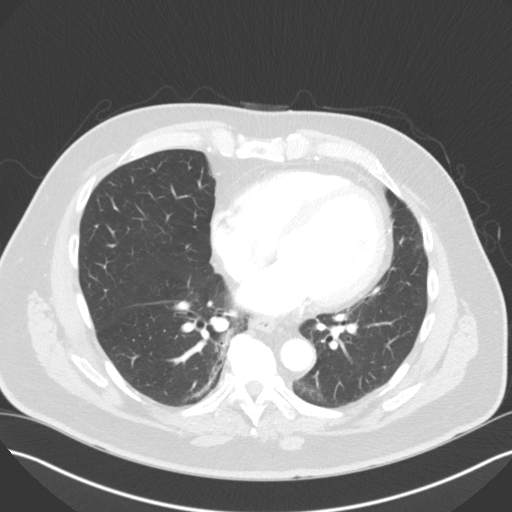
[im 62/119  soft-tissue]
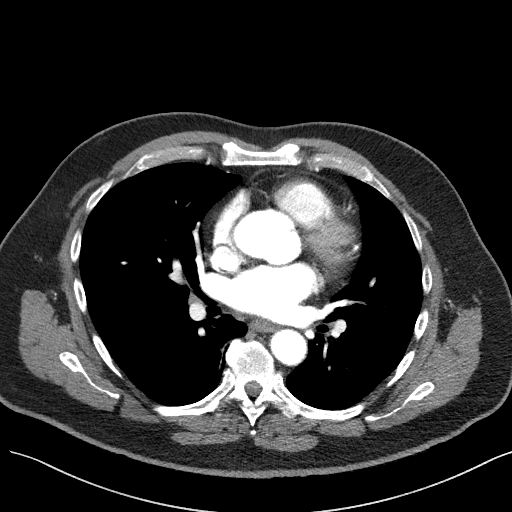
[im 66/119  lung]
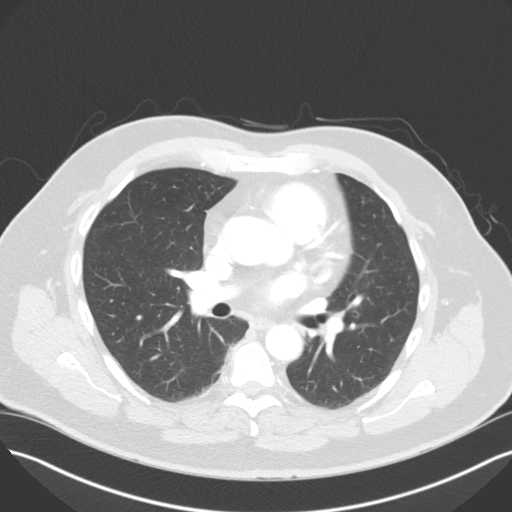
[im 75/119  soft-tissue]
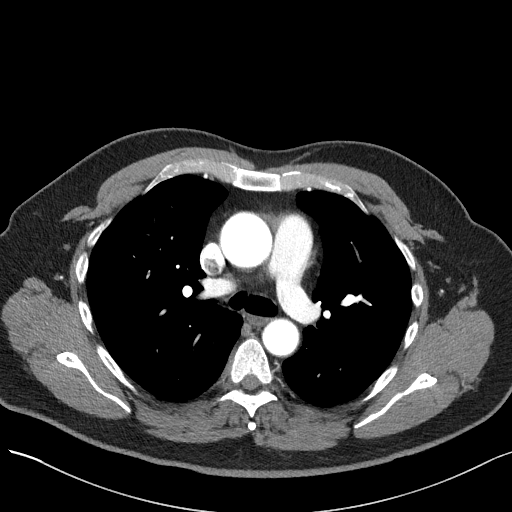
[im 84/119  lung]
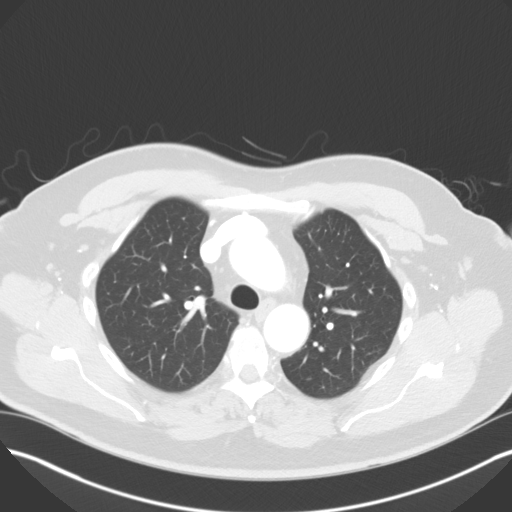
[im 88/119  soft-tissue]
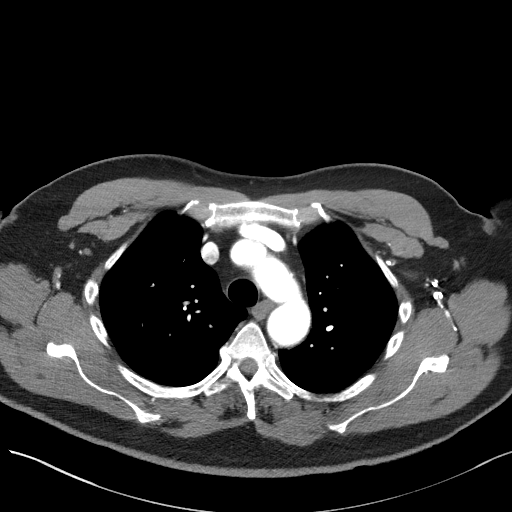
[im 97/119  lung]
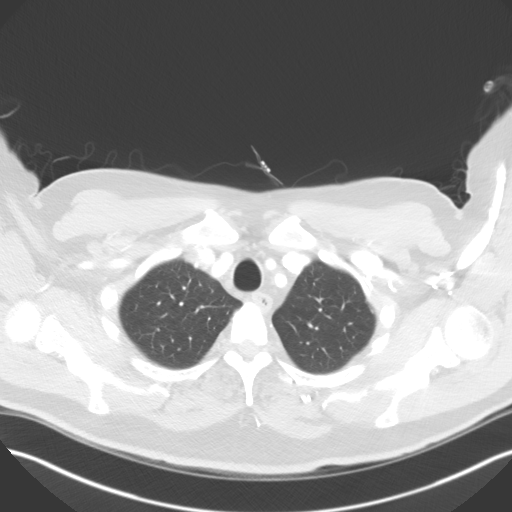
[im 105/119  soft-tissue]
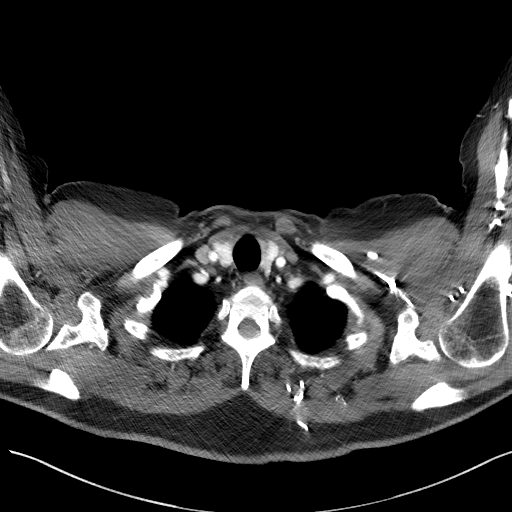
[im 114/119  lung]
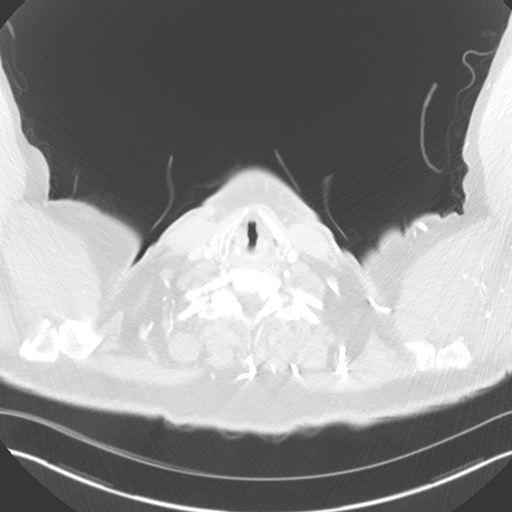

[Series 7: coronals · coronal · 0.71mm/px · 3 of 126 slices shown]
[im 32/126  soft-tissue]
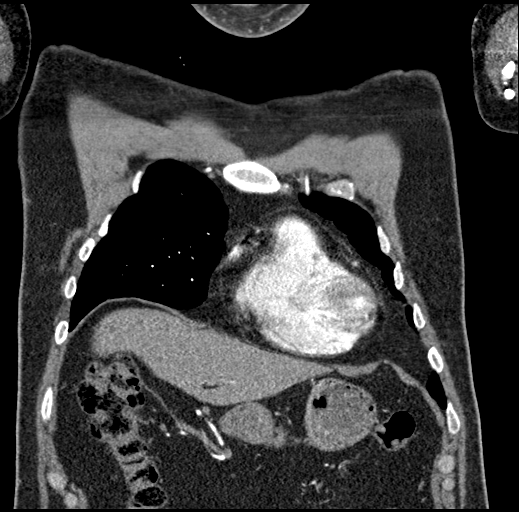
[im 63/126  soft-tissue]
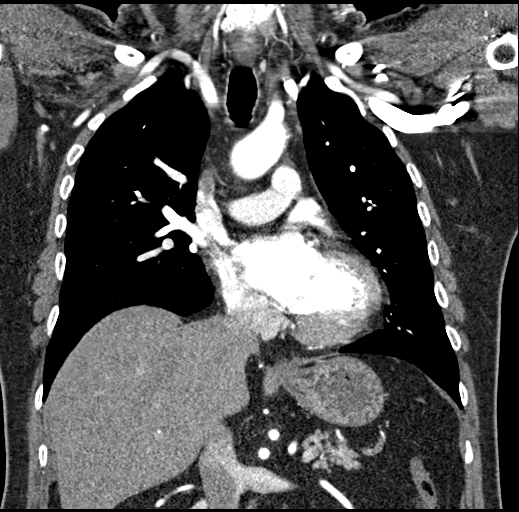
[im 94/126  soft-tissue]
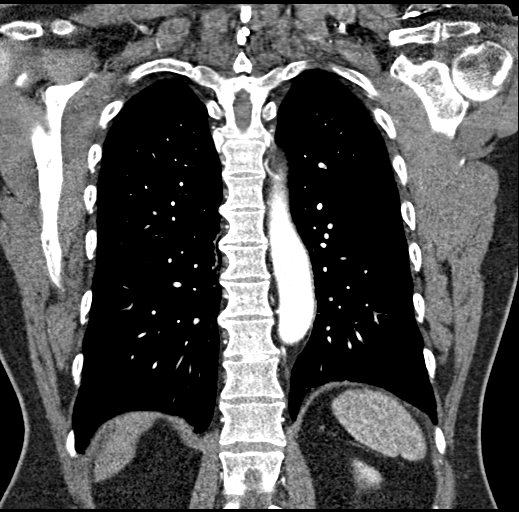

[18 of 46 positions shown; findings below may reference images not displayed]

FINDINGS: Cardiovascular: The rhythm is normal. The right ventricle is
nondilated. Fair contrast opacification of pulmonary artery
branches; the exam was not optimized for detection of pulmonary
emboli. Coronary calcifications. Good contrast opacification of the
thoracic aorta no evidence of dissection or stenosis. Transverse
dimensions as follows:

4.7 cm sinuses of Valsalva

3.7 cm Ubaldini junction

4.1 cm mid ascending (stable since previous)

3.7 cm distal ascending/proximal arch

2.6 cm distal arch

3.2 cm proximal descending

2.3 cm distal descending

Classic 3 vessel brachiocephalic arterial origin anatomy without
proximal stenosis. Minimal scattered atheromatous plaque in the
distal arch. Visualized proximal abdominal aorta unremarkable.

Mediastinum/Nodes: No hilar or mediastinal adenopathy.

Lungs/Pleura: No pleural effusion. No pneumothorax. Stable linear
scarring or subsegmental atelectasis posterolaterally in the right
lower lobe. Lungs otherwise clear.

Upper Abdomen: No acute findings

Musculoskeletal: No chest wall abnormality. No acute or significant
osseous findings.

Review of the MIP images confirms the above findings.
IMPRESSION: 1. Stable 4.1 cm ascending thoracic Aortic aneurysm NOS
(Y706Y-658.V) without complicating features. Recommend annual
imaging followup by CTA or MRA. This recommendation follows 2555
ACCF/AHA/AATS/ACR/ASA/SCA/MAT/BADILLO/SALNAVE/TIMAH Guidelines for the
Diagnosis and Management of Patients with Thoracic Aortic Disease.
Circulation. 2555; 121: [id number]
2. Coronary and Aortic Atherosclerosis (Y706Y-170.0).

## 2019-11-28 DIAGNOSIS — Z03818 Encounter for observation for suspected exposure to other biological agents ruled out: Secondary | ICD-10-CM | POA: Diagnosis not present

## 2019-12-21 DIAGNOSIS — M25562 Pain in left knee: Secondary | ICD-10-CM | POA: Diagnosis not present

## 2020-01-01 DIAGNOSIS — Z03818 Encounter for observation for suspected exposure to other biological agents ruled out: Secondary | ICD-10-CM | POA: Diagnosis not present

## 2020-01-02 DIAGNOSIS — Z03818 Encounter for observation for suspected exposure to other biological agents ruled out: Secondary | ICD-10-CM | POA: Diagnosis not present

## 2020-01-07 DIAGNOSIS — K429 Umbilical hernia without obstruction or gangrene: Secondary | ICD-10-CM | POA: Diagnosis not present

## 2020-01-21 DIAGNOSIS — Z03818 Encounter for observation for suspected exposure to other biological agents ruled out: Secondary | ICD-10-CM | POA: Diagnosis not present

## 2020-01-23 DIAGNOSIS — K429 Umbilical hernia without obstruction or gangrene: Secondary | ICD-10-CM | POA: Diagnosis not present

## 2020-02-13 DIAGNOSIS — Z03818 Encounter for observation for suspected exposure to other biological agents ruled out: Secondary | ICD-10-CM | POA: Diagnosis not present

## 2020-03-18 DIAGNOSIS — Z20828 Contact with and (suspected) exposure to other viral communicable diseases: Secondary | ICD-10-CM | POA: Diagnosis not present

## 2020-03-27 DIAGNOSIS — Z961 Presence of intraocular lens: Secondary | ICD-10-CM | POA: Diagnosis not present

## 2020-03-27 DIAGNOSIS — H43392 Other vitreous opacities, left eye: Secondary | ICD-10-CM | POA: Diagnosis not present

## 2020-03-27 DIAGNOSIS — H3322 Serous retinal detachment, left eye: Secondary | ICD-10-CM | POA: Diagnosis not present

## 2020-03-27 DIAGNOSIS — H43813 Vitreous degeneration, bilateral: Secondary | ICD-10-CM | POA: Diagnosis not present

## 2020-04-16 DIAGNOSIS — Z03818 Encounter for observation for suspected exposure to other biological agents ruled out: Secondary | ICD-10-CM | POA: Diagnosis not present

## 2020-04-21 DIAGNOSIS — Z03818 Encounter for observation for suspected exposure to other biological agents ruled out: Secondary | ICD-10-CM | POA: Diagnosis not present

## 2020-04-25 DIAGNOSIS — Z13220 Encounter for screening for lipoid disorders: Secondary | ICD-10-CM | POA: Diagnosis not present

## 2020-04-25 DIAGNOSIS — Z0000 Encounter for general adult medical examination without abnormal findings: Secondary | ICD-10-CM | POA: Diagnosis not present

## 2020-04-25 DIAGNOSIS — Z23 Encounter for immunization: Secondary | ICD-10-CM | POA: Diagnosis not present

## 2020-04-25 DIAGNOSIS — Z125 Encounter for screening for malignant neoplasm of prostate: Secondary | ICD-10-CM | POA: Diagnosis not present

## 2020-04-25 DIAGNOSIS — I10 Essential (primary) hypertension: Secondary | ICD-10-CM | POA: Diagnosis not present

## 2020-05-08 MED ORDER — ROSUVASTATIN CALCIUM 10 MG PO TABS: 10.0000 mg | ORAL_TABLET | Freq: Every day | ORAL | 0 refills | Status: DC

## 2020-05-08 NOTE — Telephone Encounter (Signed)
Nahser, Deloris Ping, MD to Louis Husbands Tyrus "Louis Delgado"      1:35 PM Hi Louis Delgado,  Its your LDL that is 104.   I would agree with increasing your rosuvastatin to 10  Recheck labs in 3 months either with [place] or Dr. Modesto Charon ( lipids, liver enz, BMP ) and we will see what it looks like  Thanks  Phil Nahser    Pts dose increase of rosuvastatin 10 mg po daily was sent to his confirmed pharmacy of choice.  Pt is already scheduled for follow-up labs in 3 months for 06/20/20, to recheck lipiids, BMP, and LFTs.  This was previously scheduled by Sheppard Evens RN in triage.  Pt made aware that his increased dose of rosuvastatin 10 mg po daily was sent to his pharmacy of choice, via [employer].

## 2020-06-20 ENCOUNTER — Other Ambulatory Visit: Payer: BC Managed Care – PPO | Admitting: *Deleted

## 2020-06-20 ENCOUNTER — Other Ambulatory Visit: Payer: Self-pay

## 2020-06-20 DIAGNOSIS — I712 Thoracic aortic aneurysm, without rupture, unspecified: Secondary | ICD-10-CM

## 2020-06-20 DIAGNOSIS — I10 Essential (primary) hypertension: Secondary | ICD-10-CM

## 2020-06-20 DIAGNOSIS — I351 Nonrheumatic aortic (valve) insufficiency: Secondary | ICD-10-CM | POA: Diagnosis not present

## 2020-06-20 LAB — BASIC METABOLIC PANEL
BUN/Creatinine Ratio: 16 (ref 10–24)
BUN: 16 mg/dL (ref 8–27)
CO2: 25 mmol/L (ref 20–29)
Calcium: 9.2 mg/dL (ref 8.6–10.2)
Chloride: 104 mmol/L (ref 96–106)
Creatinine, Ser: 1.01 mg/dL (ref 0.76–1.27)
Glucose: 93 mg/dL (ref 65–99)
Potassium: 4.5 mmol/L (ref 3.5–5.2)
Sodium: 143 mmol/L (ref 134–144)
eGFR: 85 mL/min/{1.73_m2} (ref >59)

## 2020-06-20 LAB — HEPATIC FUNCTION PANEL
ALT: 42 IU/L (ref 0–44)
AST: 27 IU/L (ref 0–40)
Albumin: 4.4 g/dL (ref 3.8–4.9)
Alkaline Phosphatase: 117 IU/L (ref 44–121)
Bilirubin Total: 0.7 mg/dL (ref 0.0–1.2)
Bilirubin, Direct: 0.24 mg/dL (ref 0.00–0.40)
Total Protein: 6.1 g/dL (ref 6.0–8.5)

## 2020-06-20 LAB — LIPID PANEL
Chol/HDL Ratio: 2.7 ratio (ref 0.0–5.0)
Cholesterol, Total: 131 mg/dL (ref 100–199)
HDL: 48 mg/dL (ref >39)
LDL Chol Calc (NIH): 67 mg/dL (ref 0–99)
Triglycerides: 83 mg/dL (ref 0–149)
VLDL Cholesterol Cal: 16 mg/dL (ref 5–40)

## 2020-06-27 ENCOUNTER — Other Ambulatory Visit: Payer: Self-pay

## 2020-06-27 ENCOUNTER — Ambulatory Visit: Payer: BC Managed Care – PPO | Admitting: Cardiovascular Disease

## 2020-06-27 ENCOUNTER — Encounter: Payer: Self-pay | Admitting: Cardiovascular Disease

## 2020-06-27 VITALS — BP 138/84 | HR 61 | Ht 71.0 in | Wt 210.0 lb

## 2020-06-27 DIAGNOSIS — I10 Essential (primary) hypertension: Secondary | ICD-10-CM | POA: Diagnosis not present

## 2020-06-27 DIAGNOSIS — I351 Nonrheumatic aortic (valve) insufficiency: Secondary | ICD-10-CM

## 2020-06-27 NOTE — Patient Instructions (Addendum)
[employer] [facility] [phone] Janey Greaser and Lavera Guise    Medication Instructions:  Your physician recommends that you continue on your current medications as directed. Please refer to the Current Medication list given to you today.  *If you need a refill on your cardiac medications before your next appointment, please call your pharmacy*   Lab Work:  BMET, Lipids, ALT Your physician recommends that you return for lab work in: 1 year on the day of or a few days before your office visit with Dr. Elease Hashimoto.  You will need to FAST for this appointment - nothing to eat or drink after midnight the night before except water.    Testing/Procedures: Your physician has requested that you have an echocardiogram. Echocardiography is a painless test that uses sound waves to create images of your heart. It provides your doctor with information about the size and shape of your heart and how well your heart's chambers and valves are working. This procedure takes approximately one hour. There are no restrictions for this procedure.     Follow-Up: At [facility], you and your health needs are our priority.  As part of our continuing mission to provide you with exceptional heart care, we have created designated Provider Care Teams.  These Care Teams include your primary Cardiologist (physician) and Advanced Practice Providers (APPs -  Physician Assistants and Nurse Practitioners) who all work together to provide you with the care you need, when you need it.   Your next appointment:   1 year(s)  The format for your next appointment:   In Person  Provider:   You may see Dr. Elease Hashimoto or one of the following Advanced Practice Providers on your designated Care Team:    Tereso Newcomer, PA-C  Vin Pasatiempo, [place]

## 2020-06-27 NOTE — Progress Notes (Signed)
Louis Delgado Date of Birth  [birth year]       [facility]    [place] [address]  [address] [place], [place]  [address]   [place], [place]  [address] [phone]     [phone]   Fax  [phone]    Fax [phone]  Problem List: 1. Hypertension 2. Hyperlipidemia 3.  Aortic insufficiency   Previous notes Louis Delgado is a 60 y.o. gentleman with a hx of a  aortic insufficiency, hypertension, and hyperlipidemia. He's done well from a cardiac standpoint. He's not had any episodes of chest pain or shortness of breath. He has not been exercising quite as much as he would like but is looking forward to getting back into his exercise regimen.  He has not been getting as much exercise that he would like and he has gained some weight.   He has gained about 5 pounds.   He is otherwise feeling well.  Feb. 27, 2014 Louis Delgado is doing well from a cardiac standpoint.  He has been having some tingling and numbness in his fingers and toes.    It typically resolved if he moves his hands around.  No CP or dyspnea.  November 27, 2012:  Louis Delgado is doing well. He's having any episodes of chest pain or shortness breath. He has bicuspid aortic valve/aortic insufficiency that we have been following. He's not having any symptoms related to the aortic insufficiency. His blood pressure and heart rate at been well-controlled.  Feb. 11, 2015:  Louis Delgado is doing OK.   His TEE showed moderate AI but a 3 leaflet aortic valve.    Getting out and exercising.   His recent labs showed good lipid levels.   October 18, 2014:  Louis Delgado is seen back today for follow up of his aortic insufficiency . Has been seen in the lipid clinic - lipids have been ok Has had a detached retina and has not been exercising as much this past spring. No CP , no dyspnea  August 05, 2015  Louis Delgado is seen back today  Doing well Had an episode of blepharitis  Thinks that he may need another sleep study. Snores  loudly , Had a sleep study 7-8 years ago - had borderline OSA at that time.  More fatigued recently.   Has an AM headache frequently  Exercising several times a week .   Sep 06, 2016:  Louis Delgado is seen today for follow up  Overall feels well Exercising several times a week .     December 16, 2017: Louis Delgado  seen today for follow-up visit.  He has a history of hypertension and mild aortic insufficiency. Feeling well Had a kidney stone this past summer  Has lithotripsy  CT scan during his work up showed coronary calcifications.  He remains asymptomatic and has not had any episodes of chest pain. He is on crestor 5 mg just 4 times a week LDL was 91 yesterday   Noted that 1 of his EKGs revealed nonspecific interventricular conduction delay. We discussed the benign nature of this.  June 27, 2019:  Louis Delgado is seen today for follow-up of his coronary artery calcifications, hyperlipidemia , aortic insufficiency.  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  This mild dilatation has remained unchanged for years.  Recent labs look stable.  His basic metabolic profile reveals a creatinine of 1.01.  His  potassium is 4.6.  Total cholesterol is 147.  HDL is 52.  Triglyceride levels 100.  LDL 77.   Echocardiogram in May, 2020 reveals normal left ventricular systolic function.  He has mild (grade 1) diastolic dysfunction.  He has mild aortic insufficiency.  The ascending aorta was thought to be mildly dilated and measured 40 mm. Exercising some. No DOE .   No fatigue.  No cough No fever   June 27, 2020:  Type seen today for follow-up of his coronary artery calcifications, hyperlipidemia, aortic insufficiency.  He has a minimally dilated ascending aorta measuring 3.8 x 3.8 by CT angiogram in May, 2020.  His aortic size has remained unchanged.  Has joined [employer] Working out , has losing weight .  Is on rosuvastatin 10 mg a day   He has cut out red  meat. Fried foods, Desserts Sweet tea  Soda   Current Outpatient Medications on File Prior to Visit  Medication Sig Dispense Refill  . aspirin 81 MG tablet Take 81 mg by mouth at bedtime.    . metoprolol tartrate (LOPRESSOR) 25 MG tablet TAKE 1 TABLET BY MOUTH TWICE DAILY. GENERIC EQUIVALENT FOR LOPRESSOR 180 tablet 3  . rosuvastatin (CRESTOR) 10 MG tablet Take 1 tablet (10 mg total) by mouth daily. 90 tablet 0   No current facility-administered medications on file prior to visit.    Allergies  Allergen Reactions  . Crestor [Rosuvastatin]     Possible memory issue when taking Crestor daily at high dose. Patient is taking crestor is still taking daily.  . Hydrochlorothiazide Nausea Only and Other (See Comments)    Made him sick    Past Medical History:  Diagnosis Date  . Bicuspid aortic valve    moderate aortic isufficiency  . Chest pain   . Gastroesophageal reflux disease   . Hiatal hernia   . History of kidney stones   . Hypercholesterolemia   . Hyperlipidemia   . Hypertension   . Kidney stones   . Left ventricular hypertrophy    mild    Past Surgical History:  Procedure Laterality Date  . CARDIOVASCULAR STRESS TEST  11-23-2005   EF 55%  . CATARACT EXTRACTION Bilateral November & December 2013  . EXTRACORPOREAL SHOCK WAVE LITHOTRIPSY Left 09/19/2017   Procedure: LEFT EXTRACORPOREAL SHOCK WAVE LITHOTRIPSY (ESWL);  Surgeon: Crista Elliot, MD;  Location: [facility];  Service: Urology;  Laterality: Left;  . RETINAL DETACHMENT SURGERY Left 2016  . TEE WITHOUT CARDIOVERSION N/A 01/11/2013   Procedure: TRANSESOPHAGEAL ECHOCARDIOGRAM (TEE);  Surgeon: Vesta Mixer, MD;  Location: [facility];  Service: Cardiovascular;  Laterality: N/A;  . US ECHOCARDIOGRAPHY  06-23-2009   Est EF 55-60%    Social History   Tobacco Use  Smoking Status Never Smoker  Smokeless Tobacco Never Used    Social History   Substance and Sexual Activity  Alcohol Use No    Family History   Problem Relation Age of Onset  . Dementia Father   . Hypertension Other     Reviw of Systems:  Reviewed in the HPI.  All other systems are negative.   Physical Exam: Blood pressure 138/84, pulse 61, height 5\' 11"  (1.803 m), weight 210 lb (95.3 kg), SpO2 95 %.  GEN:  Well nourished, well developed in no acute distress HEENT: Normal NECK: No JVD; No carotid bruits LYMPHATICS: No lymphadenopathy CARDIAC: RRR ,  Soft systolic murmur   RESPIRATORY:  Clear to auscultation without rales, wheezing or rhonchi  ABDOMEN:  Soft, non-tender, non-distended MUSCULOSKELETAL:  No edema; No deformity  SKIN: Warm and dry NEUROLOGIC:  Alert and oriented x 3   ECG:    June 27, 2020: Sinus rhythm at 61.  Right bundle branch block.  Assessment / Plan:   1. Hypertension - BP is well controlled.   Cont weight loss   2. Hyperlipidemia -    Labs are much better on the rosuvastatin and better diet, seight loss   3.  Aortic insufficiency -  Stable  Will get an echo to check this    4.  Mild aortic dilatation:  Cont BP control    Kristeen Miss, MD  06/27/2020 3:32 PM    [facility] [address] [place],  [address] [place], [place]  [address] Pager [phone] Phone: [phone]; Fax: [phone]

## 2020-07-01 DIAGNOSIS — Z03818 Encounter for observation for suspected exposure to other biological agents ruled out: Secondary | ICD-10-CM | POA: Diagnosis not present

## 2020-07-24 ENCOUNTER — Other Ambulatory Visit: Payer: Self-pay

## 2020-07-24 ENCOUNTER — Ambulatory Visit (HOSPITAL_COMMUNITY): Payer: BC Managed Care – PPO | Attending: Internal Medicine

## 2020-07-24 DIAGNOSIS — I351 Nonrheumatic aortic (valve) insufficiency: Secondary | ICD-10-CM | POA: Insufficient documentation

## 2020-07-24 LAB — ECHOCARDIOGRAM COMPLETE
Area-P 1/2: 2.51 cm2
P 1/2 time: 776 msec
S' Lateral: 3.9 cm

## 2020-07-28 DIAGNOSIS — Z03818 Encounter for observation for suspected exposure to other biological agents ruled out: Secondary | ICD-10-CM | POA: Diagnosis not present

## 2020-08-04 ENCOUNTER — Other Ambulatory Visit: Payer: Self-pay | Admitting: Cardiovascular Disease

## 2020-08-22 ENCOUNTER — Other Ambulatory Visit: Payer: Self-pay | Admitting: Cardiovascular Disease

## 2020-08-22 ENCOUNTER — Telehealth: Payer: Self-pay | Admitting: Cardiovascular Disease

## 2020-08-22 MED ORDER — METOPROLOL TARTRATE 25 MG PO TABS: ORAL_TABLET | ORAL | 3 refills | Status: DC

## 2020-08-22 NOTE — Telephone Encounter (Signed)
*  STAT* If patient is at the pharmacy, call can be transferred to refill team.   1. Which medications need to be refilled? (please list name of each medication and dose if known)  metoprolol tartrate (LOPRESSOR) 25 MG tablet  2. Which pharmacy/location (including street and city if local pharmacy) is medication to be sent to? [facility] - [place], [place] - [address]  3. Do they need a 30 day or 90 day supply?  90 day supply  

## 2020-08-22 NOTE — Telephone Encounter (Signed)
Pt's medication was sent to pt's pharmacy as requested. Confirmation received.  °

## 2020-10-24 DIAGNOSIS — Z23 Encounter for immunization: Secondary | ICD-10-CM | POA: Diagnosis not present

## 2020-10-24 DIAGNOSIS — I10 Essential (primary) hypertension: Secondary | ICD-10-CM | POA: Diagnosis not present

## 2020-10-29 DIAGNOSIS — Z961 Presence of intraocular lens: Secondary | ICD-10-CM | POA: Diagnosis not present

## 2020-10-29 DIAGNOSIS — H43813 Vitreous degeneration, bilateral: Secondary | ICD-10-CM | POA: Diagnosis not present

## 2020-11-14 ENCOUNTER — Telehealth: Payer: Self-pay | Admitting: Cardiovascular Disease

## 2020-11-14 DIAGNOSIS — U071 COVID-19: Secondary | ICD-10-CM | POA: Diagnosis not present

## 2020-11-14 MED ORDER — METOPROLOL TARTRATE 25 MG PO TABS: ORAL_TABLET | ORAL | 0 refills | Status: DC

## 2020-11-14 NOTE — Telephone Encounter (Signed)
*  STAT* If patient is at the pharmacy, call can be transferred to refill team.   1. Which medications need to be refilled? (please list name of each medication and dose if known)  rosuvastatin (CRESTOR) 10 MG tablet metoprolol tartrate (LOPRESSOR) 25 MG tablet  2. Which pharmacy/location (including street and city if local pharmacy) is medication to be sent to? [facility] [address], [place], [place] [address]  3. Do they need a 30 day or 90 day supply? Needs a week supply of medications. Runs out tomorrow and is stuck in [place] with Covid until the end of next week.

## 2020-11-14 NOTE — Telephone Encounter (Addendum)
Pt is [place] with COVID so he is not traveling home this week... I sent in to his Pharmacy his metoprolol he will restart his Crestor and will resume as soon as he gets home hopefully in a few days.

## 2021-01-12 DIAGNOSIS — Z6828 Body mass index (BMI) 28.0-28.9, adult: Secondary | ICD-10-CM | POA: Diagnosis not present

## 2021-01-12 DIAGNOSIS — H6990 Unspecified Eustachian tube disorder, unspecified ear: Secondary | ICD-10-CM | POA: Diagnosis not present

## 2021-01-22 DIAGNOSIS — H9192 Unspecified hearing loss, left ear: Secondary | ICD-10-CM | POA: Diagnosis not present

## 2021-01-22 DIAGNOSIS — H60502 Unspecified acute noninfective otitis externa, left ear: Secondary | ICD-10-CM | POA: Diagnosis not present

## 2021-01-22 DIAGNOSIS — H905 Unspecified sensorineural hearing loss: Secondary | ICD-10-CM | POA: Diagnosis not present

## 2021-02-03 DIAGNOSIS — H905 Unspecified sensorineural hearing loss: Secondary | ICD-10-CM | POA: Diagnosis not present

## 2021-02-03 DIAGNOSIS — H60502 Unspecified acute noninfective otitis externa, left ear: Secondary | ICD-10-CM | POA: Diagnosis not present

## 2021-02-03 DIAGNOSIS — H9192 Unspecified hearing loss, left ear: Secondary | ICD-10-CM | POA: Diagnosis not present

## 2021-02-03 DIAGNOSIS — H6120 Impacted cerumen, unspecified ear: Secondary | ICD-10-CM | POA: Diagnosis not present

## 2021-02-19 DIAGNOSIS — H903 Sensorineural hearing loss, bilateral: Secondary | ICD-10-CM | POA: Diagnosis not present

## 2021-02-19 DIAGNOSIS — H9042 Sensorineural hearing loss, unilateral, left ear, with unrestricted hearing on the contralateral side: Secondary | ICD-10-CM | POA: Diagnosis not present

## 2021-02-19 DIAGNOSIS — H61892 Other specified disorders of left external ear: Secondary | ICD-10-CM | POA: Diagnosis not present

## 2021-02-24 DIAGNOSIS — H9120 Sudden idiopathic hearing loss, unspecified ear: Secondary | ICD-10-CM | POA: Diagnosis not present

## 2021-02-24 DIAGNOSIS — H903 Sensorineural hearing loss, bilateral: Secondary | ICD-10-CM | POA: Diagnosis not present

## 2021-03-04 DIAGNOSIS — Z23 Encounter for immunization: Secondary | ICD-10-CM | POA: Diagnosis not present

## 2021-03-05 DIAGNOSIS — K219 Gastro-esophageal reflux disease without esophagitis: Secondary | ICD-10-CM | POA: Diagnosis not present

## 2021-03-05 DIAGNOSIS — H903 Sensorineural hearing loss, bilateral: Secondary | ICD-10-CM | POA: Diagnosis not present

## 2021-03-05 DIAGNOSIS — H61892 Other specified disorders of left external ear: Secondary | ICD-10-CM | POA: Diagnosis not present

## 2021-03-05 DIAGNOSIS — J45909 Unspecified asthma, uncomplicated: Secondary | ICD-10-CM | POA: Diagnosis not present

## 2021-03-05 DIAGNOSIS — I10 Essential (primary) hypertension: Secondary | ICD-10-CM | POA: Diagnosis not present

## 2021-03-05 DIAGNOSIS — Q231 Congenital insufficiency of aortic valve: Secondary | ICD-10-CM | POA: Diagnosis not present

## 2021-03-09 ENCOUNTER — Encounter: Payer: Self-pay | Admitting: Cardiovascular Disease

## 2021-03-19 DIAGNOSIS — H903 Sensorineural hearing loss, bilateral: Secondary | ICD-10-CM | POA: Diagnosis not present

## 2021-03-19 DIAGNOSIS — H9122 Sudden idiopathic hearing loss, left ear: Secondary | ICD-10-CM | POA: Diagnosis not present

## 2021-03-19 DIAGNOSIS — Q231 Congenital insufficiency of aortic valve: Secondary | ICD-10-CM | POA: Diagnosis not present

## 2021-03-19 DIAGNOSIS — K219 Gastro-esophageal reflux disease without esophagitis: Secondary | ICD-10-CM | POA: Diagnosis not present

## 2021-03-19 DIAGNOSIS — H61892 Other specified disorders of left external ear: Secondary | ICD-10-CM | POA: Diagnosis not present

## 2021-03-19 DIAGNOSIS — J45909 Unspecified asthma, uncomplicated: Secondary | ICD-10-CM | POA: Diagnosis not present

## 2021-03-19 DIAGNOSIS — I10 Essential (primary) hypertension: Secondary | ICD-10-CM | POA: Diagnosis not present

## 2021-03-23 DIAGNOSIS — Z0389 Encounter for observation for other suspected diseases and conditions ruled out: Secondary | ICD-10-CM | POA: Diagnosis not present

## 2021-03-23 DIAGNOSIS — H903 Sensorineural hearing loss, bilateral: Secondary | ICD-10-CM | POA: Diagnosis not present

## 2021-03-23 DIAGNOSIS — H9120 Sudden idiopathic hearing loss, unspecified ear: Secondary | ICD-10-CM | POA: Diagnosis not present

## 2021-03-29 DIAGNOSIS — S93492A Sprain of other ligament of left ankle, initial encounter: Secondary | ICD-10-CM | POA: Diagnosis not present

## 2021-03-29 DIAGNOSIS — S99912A Unspecified injury of left ankle, initial encounter: Secondary | ICD-10-CM | POA: Diagnosis not present

## 2021-04-03 DIAGNOSIS — Z1211 Encounter for screening for malignant neoplasm of colon: Secondary | ICD-10-CM | POA: Diagnosis not present

## 2021-04-03 DIAGNOSIS — D128 Benign neoplasm of rectum: Secondary | ICD-10-CM | POA: Diagnosis not present

## 2021-04-09 DIAGNOSIS — S93409A Sprain of unspecified ligament of unspecified ankle, initial encounter: Secondary | ICD-10-CM | POA: Diagnosis not present

## 2021-04-17 DIAGNOSIS — R0981 Nasal congestion: Secondary | ICD-10-CM | POA: Diagnosis not present

## 2021-04-17 DIAGNOSIS — R051 Acute cough: Secondary | ICD-10-CM | POA: Diagnosis not present

## 2021-04-17 DIAGNOSIS — R509 Fever, unspecified: Secondary | ICD-10-CM | POA: Diagnosis not present

## 2021-04-17 DIAGNOSIS — Z03818 Encounter for observation for suspected exposure to other biological agents ruled out: Secondary | ICD-10-CM | POA: Diagnosis not present

## 2021-04-20 DIAGNOSIS — J029 Acute pharyngitis, unspecified: Secondary | ICD-10-CM | POA: Diagnosis not present

## 2021-04-23 DIAGNOSIS — H903 Sensorineural hearing loss, bilateral: Secondary | ICD-10-CM | POA: Diagnosis not present

## 2021-04-23 DIAGNOSIS — H9042 Sensorineural hearing loss, unilateral, left ear, with unrestricted hearing on the contralateral side: Secondary | ICD-10-CM | POA: Diagnosis not present

## 2021-04-30 DIAGNOSIS — Z125 Encounter for screening for malignant neoplasm of prostate: Secondary | ICD-10-CM | POA: Diagnosis not present

## 2021-04-30 DIAGNOSIS — Z23 Encounter for immunization: Secondary | ICD-10-CM | POA: Diagnosis not present

## 2021-04-30 DIAGNOSIS — Z0000 Encounter for general adult medical examination without abnormal findings: Secondary | ICD-10-CM | POA: Diagnosis not present

## 2021-04-30 DIAGNOSIS — Z5181 Encounter for therapeutic drug level monitoring: Secondary | ICD-10-CM | POA: Diagnosis not present

## 2021-04-30 DIAGNOSIS — I77810 Thoracic aortic ectasia: Secondary | ICD-10-CM | POA: Diagnosis not present

## 2021-04-30 DIAGNOSIS — I10 Essential (primary) hypertension: Secondary | ICD-10-CM | POA: Diagnosis not present

## 2021-05-01 ENCOUNTER — Encounter: Payer: Self-pay | Admitting: Cardiovascular Disease

## 2021-05-25 ENCOUNTER — Other Ambulatory Visit: Payer: Self-pay | Admitting: *Deleted

## 2021-05-25 MED ORDER — METOPROLOL TARTRATE 25 MG PO TABS: ORAL_TABLET | ORAL | 0 refills | Status: DC

## 2021-05-27 ENCOUNTER — Other Ambulatory Visit: Payer: Self-pay

## 2021-05-27 MED ORDER — METOPROLOL TARTRATE 25 MG PO TABS: ORAL_TABLET | ORAL | 0 refills | Status: DC

## 2021-07-23 DIAGNOSIS — E785 Hyperlipidemia, unspecified: Secondary | ICD-10-CM | POA: Diagnosis not present

## 2021-07-30 ENCOUNTER — Ambulatory Visit: Payer: BC Managed Care – PPO | Admitting: Cardiovascular Disease

## 2021-07-30 ENCOUNTER — Encounter: Payer: Self-pay | Admitting: Cardiovascular Disease

## 2021-07-30 VITALS — BP 140/72 | HR 50 | Ht 71.0 in | Wt 203.6 lb

## 2021-07-30 DIAGNOSIS — I351 Nonrheumatic aortic (valve) insufficiency: Secondary | ICD-10-CM | POA: Diagnosis not present

## 2021-07-30 DIAGNOSIS — E782 Mixed hyperlipidemia: Secondary | ICD-10-CM

## 2021-07-30 NOTE — Progress Notes (Signed)
? ? ? ? ?Louis Delgado ?Date of Birth  01/20/1961 ?      ?[facility] ?[address]  [address] ?[place], [place]  [address]   [place], [place]  [address] ?[phone]     [phone]   ?Fax  [phone]    Fax [phone] ? ?Problem List: ?1. Hypertension ?2. Hyperlipidemia ?3.  Aortic insufficiency  ? ?Previous notes ?Louis Delgado is a 61 y.o. gentleman with a hx of a  aortic insufficiency, hypertension, and hyperlipidemia. He's done well from a cardiac standpoint. He's not had any episodes of chest pain or shortness of breath. He has not been exercising quite as much as he would like but is looking forward to getting back into his exercise regimen. ? ?He has not been getting as much exercise that he would like and he has gained some weight.   He has gained about 5 pounds.   He is otherwise feeling well. ? ?Feb. 27, 2014 ?Louis Delgado is doing well from a cardiac standpoint.  He has been having some tingling and numbness in his fingers and toes.    It typically resolved if he moves his hands around.  No CP or dyspnea. ? ?November 27, 2012: ? ?Louis Delgado is doing well. He's having any episodes of chest pain or shortness breath. He has bicuspid aortic valve/aortic insufficiency that we have been following. He's not having any symptoms related to the aortic insufficiency. His blood pressure and heart rate at been well-controlled. ? ?Feb. 11, 2015: ? ?Louis Delgado is doing OK.   His TEE showed moderate AI but a 3 leaflet aortic valve.    Getting out and exercising.   ?His recent labs showed good lipid levels.  ? ?October 18, 2014: ? ?Louis Delgado is seen back today for follow up of his aortic insufficiency . ?Has been seen in the lipid clinic - lipids have been ok ?Has had a detached retina and has not been exercising as much this past spring. ?No CP , no dyspnea ? ?August 05, 2015 ? Louis Delgado is seen back today  ?Doing well ?Had an episode of blepharitis  ?Thinks that he may need another sleep study. ?Snores  loudly , ?Had a sleep study 7-8 years ago - had borderline OSA at that time.  ?More fatigued recently.   Has an AM headache frequently  ?Exercising several times a week . ?  ?Sep 06, 2016: ? ?Louis Delgado is seen today for follow up  ?Overall feels well ?Exercising several times a week .    ? ?December 16, 2017: ?Louis Delgado  seen today for follow-up visit.  He has a history of hypertension and mild aortic insufficiency. ?Feeling well ?Had a kidney stone this past summer  ?Has lithotripsy  ?CT scan during his work up showed coronary calcifications.  He remains asymptomatic and has not had any episodes of chest pain. ?He is on crestor 5 mg just 4 times a week ?LDL was 91 yesterday  ? ?Noted that 1 of his EKGs revealed nonspecific interventricular conduction delay. ?We discussed the benign nature of this. ? ?June 27, 2019:  ?Louis Delgado is seen today for follow-up of his coronary artery calcifications, hyperlipidemia , aortic insufficiency. ? ?He has very minimal dilatation of his ascending aorta measuring 3.8 x 3.8 by CT angiogram in May 2020.  This mild dilatation has remained unchanged for years. ? ?Recent labs look stable.  His basic metabolic profile reveals a creatinine of 1.01.  His  potassium is 4.6.  Total cholesterol is 147.  HDL is 52.  Triglyceride levels 100.  LDL 77.   Echocardiogram in May, 2020 reveals normal left ventricular systolic function.  He has mild (grade 1) diastolic dysfunction.  He has mild aortic insufficiency.  The ascending aorta was thought to be mildly dilated and measured 40 mm. ?Exercising some. ?No DOE .   No fatigue.  No cough ?No fever  ? ?June 27, 2020: ? ?Type seen today for follow-up of his coronary artery calcifications, hyperlipidemia, aortic insufficiency.  He has a minimally dilated ascending aorta measuring 3.8 x 3.8 by CT angiogram in May, 2020.  His aortic size has remained unchanged. ? ?Has joined [employer] ?Working out , has losing weight .  ?Is on rosuvastatin 10 mg a day  ? ?He has cut out red  meat. ?Fried foods, ?Desserts ?Sweet tea  ?Soda ? ? ?July 30, 2021: ?Louis Delgado is seen today for follow-up of his coronary artery calcifications, hyperlipidemia, aortic insufficiency.  He has a minimally dilated ascending aorta. ?He has a trileaflet aortic valve  ? ?Working out at [employer]  2-3 times a week , no angina  ?Labs from Dr. Nash Dimmer office were reviewed ?LDL is 82, trigs are 119  ?He is currently on Rosuvastatin 20 mg a day  ?Coronary calcium score of 210. This was 5 percentile for age and ?sex matched control. ?No immediate family hx of CAD ?Will refer to our lipids  ? ?Current Outpatient Medications on File Prior to Visit  ?Medication Sig Dispense Refill  ? aspirin 81 MG tablet Take 81 mg by mouth at bedtime.    ? metoprolol tartrate (LOPRESSOR) 25 MG tablet TAKE 1 TABLET BY MOUTH TWICE DAILY. GENERIC EQUIVALENT FOR LOPRESSOR. Please keep upcoming appt in April 2023 with Dr. Elease Hashimoto before anymore refills. Thank you 180 tablet 0  ? rosuvastatin (CRESTOR) 20 MG tablet Take 20 mg by mouth daily.    ? rosuvastatin (CRESTOR) 10 MG tablet TAKE 1 TABLET BY MOUTH EVERY DAY (Patient not taking: Reported on 07/30/2021) 90 tablet 3  ? ?No current facility-administered medications on file prior to visit.  ? ? ?Allergies  ?Allergen Reactions  ? Crestor [Rosuvastatin]   ?  Possible memory issue when taking Crestor daily at high dose. Patient is taking crestor is still taking daily.  ? Hydrochlorothiazide Nausea Only and Other (See Comments)  ?  Made him sick  ? ? ?Past Medical History:  ?Diagnosis Date  ? Bicuspid aortic valve   ? moderate aortic isufficiency  ? Chest pain   ? Gastroesophageal reflux disease   ? Hiatal hernia   ? History of kidney stones   ? Hypercholesterolemia   ? Hyperlipidemia   ? Hypertension   ? Kidney stones   ? Left ventricular hypertrophy   ? mild  ? ? ?Past Surgical History:  ?Procedure Laterality Date  ? CARDIOVASCULAR STRESS TEST  11-23-2005  ? EF 55%  ? CATARACT EXTRACTION Bilateral November &  December 2013  ? EXTRACORPOREAL SHOCK WAVE LITHOTRIPSY Left 09/19/2017  ? Procedure: LEFT EXTRACORPOREAL SHOCK WAVE LITHOTRIPSY (ESWL);  Surgeon: Crista Elliot, MD;  Location: [facility];  Service: Urology;  Laterality: Left;  ? RETINAL DETACHMENT SURGERY Left 2016  ? TEE WITHOUT CARDIOVERSION N/A 01/11/2013  ? Procedure: TRANSESOPHAGEAL ECHOCARDIOGRAM (TEE);  Surgeon: Vesta Mixer, MD;  Location: [facility];  Service: Cardiovascular;  Laterality: N/A;  ? US ECHOCARDIOGRAPHY  06-23-2009  ? Est EF 55-60%  ? ? ?Social History  ? ?  Tobacco Use  ?Smoking Status Never  ?Smokeless Tobacco Never  ? ? ?Social History  ? ?Substance and Sexual Activity  ?Alcohol Use No  ? ? ?Family History  ?Problem Relation Age of Onset  ? Dementia Father   ? Hypertension Other   ? ? ?Reviw of Systems:  ?Reviewed in the HPI.  All other systems are negative. ? ?Physical Exam: ?Blood pressure 140/72, pulse (!) 50, height 5\' 11"  (1.803 m), weight 203 lb 9.6 oz (92.4 kg), SpO2 99 %. ? ?GEN:  Well nourished, well developed in no acute distress ?HEENT: Normal ?NECK: No JVD; No carotid bruits ?LYMPHATICS: No lymphadenopathy ?CARDIAC: RRR , soft diastolic murmur  ?RESPIRATORY:  Clear to auscultation without rales, wheezing or rhonchi  ?ABDOMEN: Soft, non-tender, non-distended ?MUSCULOSKELETAL:  No edema; No deformity  ?SKIN: Warm and dry ?NEUROLOGIC:  Alert and oriented x 3 ?3 ? ? ?ECG:     July 30, 2021.   Sinus brady at 50  ? ?Assessment / Plan:  ? ?1. Hypertension -his blood pressure is well controlled.  Continue current medications. ? ?2. Hyperlipidemia -     last LDL remains above goal.  He has coronary artery disease as evidenced by his coronary artery calcifications.  I think that his LDL goal is between 50 and 70.  We will refer him to the lipid clinic for consideration of PCSK9 inhibitor or inclisiran. ? ?3.  Aortic insufficiency -his aortic valve sounds unchanged.  He will likely need another echo in several years. ? ? ?4.  Mild aortic  dilatation:   His aortic dilatation appears to be stable.  The aorta measured 3.8 x 3.8 by CT angiogram in May, 2020.  We will consider repeating that next year. ? ? ?Kristeen MissPhilip Desiree Daise, MD  ?07/30/2021 9:47 AM

## 2021-07-30 NOTE — Patient Instructions (Signed)
Medication Instructions:  ?Your physician recommends that you continue on your current medications as directed. Please refer to the Current Medication list given to you today. ? ?*If you need a refill on your cardiac medications before your next appointment, please call your pharmacy* ? ?Lab Work: ?NONE ? ?Testing/Procedures: ?NONE ? ?Follow-Up: ?At [facility], you and your health needs are our priority.  As part of our continuing mission to provide you with exceptional heart care, we have created designated Provider Care Teams.  These Care Teams include your primary Cardiologist (physician) and Advanced Practice Providers (APPs -  Physician Assistants and Nurse Practitioners) who all work together to provide you with the care you need, when you need it. ? ?Your next appointment:   ?1 year(s) ? ?The format for your next appointment:   ?In Person ? ?Provider:   ?Tereso Newcomer, PA-C  ? ?Other Instructions ?Your physician has referred you to our Lipid Clinic to further manage your cholesterol. They will call you to schedule an appointment. ? ?Important Information About Sugar ? ? ? ? ?  ?

## 2021-08-17 ENCOUNTER — Other Ambulatory Visit: Payer: Self-pay | Admitting: Cardiovascular Disease

## 2021-08-21 NOTE — Progress Notes (Signed)
Patient ID: Louis Delgado                 DOB: 05-Feb-1961                    MRN: [id number] ? ? ? ? ?HPI: ?Louis Delgado is a 61 y.o. male patient referred to lipid clinic by Dr. Elease Hashimoto for consideration of PCSK9i or inclisiran. PMH is significant for aortic insufficiency w/ trileaflet aortic valve, CAC score of 210 (94th percentile) on 09/08/18, HTN, HLD. LDL is 91 mg/dL, increased from 82 mg/dL on rosuvastatin 10 mg/day. PCP increased rosuvastatin to 20 mg/day in Jan 2023.  ? ?Pt reports today for lipid management. Reports that he is tolerating rosuvastatin 20 mg alright, but feels a little "different" and "fuzzy," especially in the AM. However, it is not impacting his daily functioning. He understands the importance of further lowering his LDL-C to reduce risk of CV events, and is willing to administer an injection. He has made multiple lifestyle changes in diet and exercise to improve CV health. ? ?Current Medications: rosuvastatin 20 mg daily ?Intolerances: n/a ?Risk Factors: HTN, coronary calcifications ?LDL-C goal: <50-70 ng/mL per Nahser ? ?Diet: March 2022: cut out red meat, fried foods, desserts, sweet tea, soda. Now eats chicken, fish, salmon, started eating a little bit more red meat recently.  ? ?Exercise: working out 2-3 times/week. Walking and weight machines at [employer].  ? ?Family History: Dementia in father, HTN in other relative. ? ?Social History: Denies tobacco, alcohol, and drug use. ? ?Labs: ?07/23/21: TC 154, TG 119, HDL 51, LDL 82 (rosuvastatin 20mg  daily) ?05/01/21 (per MyChart message): TC 162, TG 108, HDL 52, LDL 91 (rosuvastatin 10mg  daily) ?06/20/20: TC131, TG 83, HDL 48, VLDL 16, LDL 67, Chol/HDL ratio 2.7 ? ? ?Past Medical History:  ?Diagnosis Date  ? Bicuspid aortic valve   ? moderate aortic isufficiency  ? Chest pain   ? Gastroesophageal reflux disease   ? Hiatal hernia   ? History of kidney stones   ? Hypercholesterolemia   ? Hyperlipidemia   ? Hypertension   ? Kidney  stones   ? Left ventricular hypertrophy   ? mild  ? ? ?Current Outpatient Medications on File Prior to Visit  ?Medication Sig Dispense Refill  ? aspirin 81 MG tablet Take 81 mg by mouth at bedtime.    ? metoprolol tartrate (LOPRESSOR) 25 MG tablet Take 1 tablet by mouth twice daily. Generic equivalent for Lopressor. 180 tablet 3  ? rosuvastatin (CRESTOR) 10 MG tablet TAKE 1 TABLET BY MOUTH EVERY DAY (Patient not taking: Reported on 07/30/2021) 90 tablet 3  ? rosuvastatin (CRESTOR) 20 MG tablet Take 20 mg by mouth daily.    ? ?No current facility-administered medications on file prior to visit.  ? ? ?Allergies  ?Allergen Reactions  ? Crestor [Rosuvastatin]   ?  Possible memory issue when taking Crestor daily at high dose. Patient is taking crestor is still taking daily.  ? Hydrochlorothiazide Nausea Only and Other (See Comments)  ?  Made him sick  ? ? ?Assessment/Plan: ? ?1. Hyperlipidemia - LDL-C uncontrolled at 82 mg/dL for goal 08/20/20 mg/dL given elevated CAC. Good candidate for PCSK9i for additional LDL lowering. While ezetimibe and bempedoic acid would also be appropriate options, additional ~20% lowering of LDL would not achieve goal. Pt agreeable to injectable medication. Provided education and counseling on PCSK9i administration and adverse effects. Pt will continue to monitor diet, with goal of reducing red meat  intake considering recent increase. Given perceived "fuzziness" or fogginess with higher doses of rosuvastatin and minimal benefit with 40 mg vs 20 mg, will continue rosuvastatin 20 mg daily. If pt continues to notice cognition changes with rosuvastatin 20 mg, consider lowering dose to rosuvastatin 10 mg pending improvement in LDL with PCSK9i. Will submit PA to determine insurance preference for Repatha vs Praluent and follow-up with pt via MyChart. Will also obtain copay card. Scheduled lab appt to obtain repeat lipid panel and APO-B on 11/19/21.  ? ?Addendum: Patient approved for Repatha through  08/23/22 ? ?Patient seen with Nils Pyle, PY4 PharmD Candidate ? ?Olene Floss, Pharm.D, BCPS, CPP ?[facility]  ?[address] [place], [place] [address]  ?Phone: [phone]; Fax: [phone]  ? ?  ?

## 2021-08-24 ENCOUNTER — Telehealth: Payer: Self-pay

## 2021-08-24 ENCOUNTER — Ambulatory Visit: Payer: BC Managed Care – PPO | Admitting: Pharmacist

## 2021-08-24 DIAGNOSIS — E782 Mixed hyperlipidemia: Secondary | ICD-10-CM

## 2021-08-24 DIAGNOSIS — F4320 Adjustment disorder, unspecified: Secondary | ICD-10-CM | POA: Diagnosis not present

## 2021-08-24 MED ORDER — REPATHA SURECLICK 140 MG/ML ~~LOC~~ SOAJ: 1.0000 "pen " | SUBCUTANEOUS | 11 refills | Status: DC

## 2021-08-24 NOTE — Telephone Encounter (Signed)
Obtained Repatha copay card information and provided details to pharmacy. Reported co-pay of $5.  ? ?RxBin: [id number] ?RxPCN: [id number]  ?RxGrp: [id number] ?ID: [id number] ?

## 2021-08-24 NOTE — Patient Instructions (Signed)
Thank you for coming in today! ? ?I will submit information to your insurance for Repatha or Praluent and let you know when I hear back.  ?  ?Repatha is a subcutaneous injection given once every 2 weeks in the fatty tissue of your stomach or upper outer thigh. Store the medication in the fridge. You can let your dose warm up to room temperature for 30 minutes before injecting if you prefer. Repatha will lower your LDL cholesterol by 60% and helps to lower your chance of having a heart attack or stroke.  ? ?We will plan to recheck you lipids about 3 months after you start your new medication  ? ? ?

## 2021-11-11 ENCOUNTER — Other Ambulatory Visit: Payer: Self-pay | Admitting: Pharmacist

## 2021-11-11 DIAGNOSIS — E782 Mixed hyperlipidemia: Secondary | ICD-10-CM

## 2021-11-19 ENCOUNTER — Other Ambulatory Visit: Payer: BC Managed Care – PPO

## 2021-11-19 DIAGNOSIS — E782 Mixed hyperlipidemia: Secondary | ICD-10-CM

## 2021-11-20 ENCOUNTER — Encounter: Payer: Self-pay | Admitting: Cardiovascular Disease

## 2021-11-20 LAB — LIPID PANEL
Chol/HDL Ratio: 1.5 ratio (ref 0.0–5.0)
Cholesterol, Total: 75 mg/dL — ABNORMAL LOW (ref 100–199)
HDL: 50 mg/dL (ref >39)
LDL Chol Calc (NIH): 9 mg/dL (ref 0–99)
Triglycerides: 75 mg/dL (ref 0–149)
VLDL Cholesterol Cal: 16 mg/dL (ref 5–40)

## 2021-11-20 LAB — APOLIPOPROTEIN B: Apolipoprotein B: 21 mg/dL (ref <90)

## 2021-11-26 DIAGNOSIS — I77810 Thoracic aortic ectasia: Secondary | ICD-10-CM | POA: Diagnosis not present

## 2021-11-26 DIAGNOSIS — E663 Overweight: Secondary | ICD-10-CM | POA: Diagnosis not present

## 2021-11-26 DIAGNOSIS — E7800 Pure hypercholesterolemia, unspecified: Secondary | ICD-10-CM | POA: Diagnosis not present

## 2021-11-26 DIAGNOSIS — I10 Essential (primary) hypertension: Secondary | ICD-10-CM | POA: Diagnosis not present

## 2022-01-11 DIAGNOSIS — H3322 Serous retinal detachment, left eye: Secondary | ICD-10-CM | POA: Diagnosis not present

## 2022-01-11 DIAGNOSIS — H43813 Vitreous degeneration, bilateral: Secondary | ICD-10-CM | POA: Diagnosis not present

## 2022-01-11 DIAGNOSIS — Z961 Presence of intraocular lens: Secondary | ICD-10-CM | POA: Diagnosis not present

## 2022-01-13 DIAGNOSIS — R220 Localized swelling, mass and lump, head: Secondary | ICD-10-CM | POA: Diagnosis not present

## 2022-01-18 DIAGNOSIS — R220 Localized swelling, mass and lump, head: Secondary | ICD-10-CM | POA: Diagnosis not present

## 2022-01-18 DIAGNOSIS — R21 Rash and other nonspecific skin eruption: Secondary | ICD-10-CM | POA: Diagnosis not present

## 2022-03-01 DIAGNOSIS — L501 Idiopathic urticaria: Secondary | ICD-10-CM | POA: Diagnosis not present

## 2022-03-30 DIAGNOSIS — Z8669 Personal history of other diseases of the nervous system and sense organs: Secondary | ICD-10-CM | POA: Diagnosis not present

## 2022-03-30 DIAGNOSIS — H903 Sensorineural hearing loss, bilateral: Secondary | ICD-10-CM | POA: Diagnosis not present

## 2022-05-05 ENCOUNTER — Encounter: Payer: Self-pay | Admitting: Cardiovascular Disease

## 2022-05-05 DIAGNOSIS — M1712 Unilateral primary osteoarthritis, left knee: Secondary | ICD-10-CM | POA: Diagnosis not present

## 2022-05-05 DIAGNOSIS — M25562 Pain in left knee: Secondary | ICD-10-CM | POA: Diagnosis not present

## 2022-05-11 ENCOUNTER — Encounter: Payer: Self-pay | Admitting: Cardiovascular Disease

## 2022-05-11 DIAGNOSIS — Z125 Encounter for screening for malignant neoplasm of prostate: Secondary | ICD-10-CM | POA: Diagnosis not present

## 2022-05-11 DIAGNOSIS — Z0000 Encounter for general adult medical examination without abnormal findings: Secondary | ICD-10-CM | POA: Diagnosis not present

## 2022-05-11 DIAGNOSIS — E785 Hyperlipidemia, unspecified: Secondary | ICD-10-CM | POA: Diagnosis not present

## 2022-05-11 DIAGNOSIS — Z5181 Encounter for therapeutic drug level monitoring: Secondary | ICD-10-CM | POA: Diagnosis not present

## 2022-05-11 DIAGNOSIS — M1712 Unilateral primary osteoarthritis, left knee: Secondary | ICD-10-CM | POA: Diagnosis not present

## 2022-05-11 DIAGNOSIS — Z72820 Sleep deprivation: Secondary | ICD-10-CM | POA: Diagnosis not present

## 2022-05-11 DIAGNOSIS — I10 Essential (primary) hypertension: Secondary | ICD-10-CM | POA: Diagnosis not present

## 2022-05-20 ENCOUNTER — Other Ambulatory Visit: Payer: Self-pay | Admitting: Cardiovascular Disease

## 2022-07-22 ENCOUNTER — Other Ambulatory Visit: Payer: Self-pay | Admitting: Cardiovascular Disease

## 2022-07-28 ENCOUNTER — Encounter: Payer: Self-pay | Admitting: Cardiovascular Disease

## 2022-07-28 NOTE — Progress Notes (Unsigned)
Louis Delgado Date of Birth  [birth year]       [facility]    [place] [address]  [address] [place], [place]  [address]   [place], [place]  [address] [phone]     [phone]   Fax  [phone]    Fax [phone]  Problem List: 1. Hypertension 2. Hyperlipidemia 3.  Aortic insufficiency   Previous notes Louis Delgado is a 62 y.o. gentleman with a hx of a  aortic insufficiency, hypertension, and hyperlipidemia. He's done well from a cardiac standpoint. He's not had any episodes of chest pain or shortness of breath. He has not been exercising quite as much as he would like but is looking forward to getting back into his exercise regimen.  He has not been getting as much exercise that he would like and he has gained some weight.   He has gained about 5 pounds.   He is otherwise feeling well.  Feb. 27, 2014 Louis Delgado is doing well from a cardiac standpoint.  He has been having some tingling and numbness in his fingers and toes.    It typically resolved if he moves his hands around.  No CP or dyspnea.  November 27, 2012:  Louis Delgado is doing well. He's having any episodes of chest pain or shortness breath. He has bicuspid aortic valve/aortic insufficiency that we have been following. He's not having any symptoms related to the aortic insufficiency. His blood pressure and heart rate at been well-controlled.  Feb. 11, 2015:  Louis Delgado is doing OK.   His TEE showed moderate AI but a 3 leaflet aortic valve.    Getting out and exercising.   His recent labs showed good lipid levels.   October 18, 2014:  Louis Delgado is seen back today for follow up of his aortic insufficiency . Has been seen in the lipid clinic - lipids have been ok Has had a detached retina and has not been exercising as much this past spring. No CP , no dyspnea  August 05, 2015  Louis Delgado is seen back today  Doing well Had an episode of blepharitis  Thinks that he may need another sleep study. Snores  loudly , Had a sleep study 7-8 years ago - had borderline OSA at that time.  More fatigued recently.   Has an AM headache frequently  Exercising several times a week .   Sep 06, 2016:  Louis Delgado is seen today for follow up  Overall feels well Exercising several times a week .     December 16, 2017: Louis Delgado  seen today for follow-up visit.  He has a history of hypertension and mild aortic insufficiency. Feeling well Had a kidney stone this past summer  Has lithotripsy  CT scan during his work up showed coronary calcifications.  He remains asymptomatic and has not had any episodes of chest pain. He is on crestor 5 mg just 4 times a week LDL was 91 yesterday   Noted that 1 of his EKGs revealed nonspecific interventricular conduction delay. We discussed the benign nature of this.  June 27, 2019:  Louis Delgado is seen today for follow-up of his coronary artery calcifications, hyperlipidemia , aortic insufficiency.  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  This mild dilatation has remained unchanged for years.  Recent labs look stable.  His basic metabolic profile reveals a creatinine of 1.01.  His  potassium is 4.6.  Total cholesterol is 147.  HDL is 52.  Triglyceride levels 100.  LDL 77.   Echocardiogram in May, 2020 reveals normal left ventricular systolic function.  He has mild (grade 1) diastolic dysfunction.  He has mild aortic insufficiency.  The ascending aorta was thought to be mildly dilated and measured 40 mm. Exercising some. No DOE .   No fatigue.  No cough No fever   June 27, 2020:  Type seen today for follow-up of his coronary artery calcifications, hyperlipidemia, aortic insufficiency.  He has a minimally dilated ascending aorta measuring 3.8 x 3.8 by CT angiogram in May, 2020.  His aortic size has remained unchanged.  Has joined [employer] Working out , has losing weight .  Is on rosuvastatin 10 mg a day   He has cut out red  meat. Fried foods, Desserts Sweet tea  Soda   July 30, 2021: Louis Delgado is seen today for follow-up of his coronary artery calcifications, hyperlipidemia, aortic insufficiency.  He has a minimally dilated ascending aorta. He has a trileaflet aortic valve   Working out at [employer]  2-3 times a week , no angina  Labs from Dr. Nash Dimmer office were reviewed LDL is 82, trigs are 119  He is currently on Rosuvastatin 20 mg a day  Coronary calcium score of 210. This was 98 percentile for age and sex matched control. No immediate family hx of CAD Will refer to our   July 29, 2022  Louis Delgado is seen for follow up of his coronary calcification HLD, minimally dilated aorta,  Has aortic insufficiency ( has a 3 leaflet valve )  Working out at [employer], [employer] 3 miles ,works out on machines regularly   No CP. No dyspnea  Is on Repatha -  LDL is 9  Doing well    Current Outpatient Medications on File Prior to Visit  Medication Sig Dispense Refill   aspirin 81 MG tablet Take 81 mg by mouth at bedtime.     Evolocumab (REPATHA SURECLICK) 140 MG/ML SOAJ INJECT 1 PEN. INTO THE SKIN EVERY 14 (FOURTEEN) DAYS. 2 mL 11   metoprolol tartrate (LOPRESSOR) 25 MG tablet Take 1 tablet by mouth twice daily. 180 tablet 0   rosuvastatin (CRESTOR) 20 MG tablet Take 20 mg by mouth daily.     No current facility-administered medications on file prior to visit.    Allergies  Allergen Reactions   Crestor [Rosuvastatin]     Possible memory issue when taking Crestor daily at high dose. Patient is taking crestor is still taking daily.   Hydrochlorothiazide Nausea Only and Other (See Comments)    Made him sick    Past Medical History:  Diagnosis Date   Bicuspid aortic valve    moderate aortic isufficiency   Chest pain    Gastroesophageal reflux disease    Hiatal hernia    History of kidney stones    Hypercholesterolemia    Hyperlipidemia    Hypertension    Kidney stones    Left ventricular hypertrophy    mild     Past Surgical History:  Procedure Laterality Date   CARDIOVASCULAR STRESS TEST  11-23-2005   EF 55%   CATARACT EXTRACTION Bilateral November & December 2013   EXTRACORPOREAL SHOCK WAVE LITHOTRIPSY Left 09/19/2017   Procedure: LEFT EXTRACORPOREAL SHOCK WAVE LITHOTRIPSY (ESWL);  Surgeon: Crista Elliot, MD;  Location: [facility];  Service: Urology;  Laterality: Left;   RETINAL DETACHMENT SURGERY Left 2016   TEE WITHOUT  CARDIOVERSION N/A 01/11/2013   Procedure: TRANSESOPHAGEAL ECHOCARDIOGRAM (TEE);  Surgeon: Vesta Mixer, MD;  Location: [facility];  Service: Cardiovascular;  Laterality: N/A;   US ECHOCARDIOGRAPHY  06-23-2009   Est EF 55-60%    Social History   Tobacco Use  Smoking Status Never  Smokeless Tobacco Never    Social History   Substance and Sexual Activity  Alcohol Use No    Family History  Problem Relation Age of Onset   Dementia Father    Hypertension Other     Reviw of Systems:  Reviewed in the HPI.  All other systems are negative.  Physical Exam: Physical Exam: Blood pressure 120/80, pulse (!) 54, height 5' 10.5" (1.791 m), weight 206 lb 9.6 oz (93.7 kg), SpO2 99 %.       GEN:  Well nourished, well developed in no acute distress HEENT: Normal NECK: No JVD; No carotid bruits LYMPHATICS: No lymphadenopathy CARDIAC: RRR ,  very soft systolic and diastolic murmur  RESPIRATORY:  Clear to auscultation without rales, wheezing or rhonchi  ABDOMEN: Soft, non-tender, non-distended MUSCULOSKELETAL:  No edema; No deformity  SKIN: Warm and dry NEUROLOGIC:  Alert and oriented x 3      ECG:     July 29, 2022 : sinus brady at 42.  RBBB .   Assessment / Plan:   1. Hypertension -  BP is well controlled.   2. Hyperlipidemia -     LDL is 9  . Cont current meds    3.  Aortic insufficiency -stable     4.  Mild aortic dilatation:   His aortic dilatation appears to be stable.  The aorta measured 3.8 x 3.8 by CT angiogram in May, 2020.  Echo       Kristeen Miss, MD  07/29/2022 12:56 PM    [facility] [address] [place],  [address] [place], [place]  [address] Pager [phone] Phone: [phone]; Fax: [phone]

## 2022-07-29 ENCOUNTER — Ambulatory Visit: Payer: BC Managed Care – PPO | Attending: Cardiovascular Disease | Admitting: Cardiovascular Disease

## 2022-07-29 ENCOUNTER — Encounter: Payer: Self-pay | Admitting: Cardiovascular Disease

## 2022-07-29 VITALS — BP 120/80 | HR 54 | Ht 70.5 in | Wt 206.6 lb

## 2022-07-29 DIAGNOSIS — I77819 Aortic ectasia, unspecified site: Secondary | ICD-10-CM

## 2022-07-29 DIAGNOSIS — I351 Nonrheumatic aortic (valve) insufficiency: Secondary | ICD-10-CM | POA: Diagnosis not present

## 2022-07-29 NOTE — Patient Instructions (Signed)
Medication Instructions:  Your physician recommends that you continue on your current medications as directed. Please refer to the Current Medication list given to you today. *If you need a refill on your cardiac medications before your next appointment, please call your pharmacy*   Testing/Procedures: Echocardiogram Your physician has requested that you have an echocardiogram. Echocardiography is a painless test that uses sound waves to create images of your heart. It provides your doctor with information about the size and shape of your heart and how well your heart's chambers and valves are working. This procedure takes approximately one hour. There are no restrictions for this procedure. Please do NOT wear cologne, perfume, aftershave, or lotions (deodorant is allowed). Please arrive 15 minutes prior to your appointment time.    Follow-Up: At [facility], you and your health needs are our priority.  As part of our continuing mission to provide you with exceptional heart care, we have created designated Provider Care Teams.  These Care Teams include your primary Cardiologist (physician) and Advanced Practice Providers (APPs -  Physician Assistants and Nurse Practitioners) who all work together to provide you with the care you need, when you need it.  We recommend signing up for the patient portal called "MyChart".  Sign up information is provided on this After Visit Summary.  MyChart is used to connect with patients for Virtual Visits (Telemedicine).  Patients are able to view lab/test results, encounter notes, upcoming appointments, etc.  Non-urgent messages can be sent to your provider as well.   To learn more about what you can do with MyChart, go to [email or web address].    Your next appointment:   1 year(s)  Provider:   Philip Nahser, MD   

## 2022-08-26 ENCOUNTER — Encounter: Payer: Self-pay | Admitting: Cardiovascular Disease

## 2022-08-26 ENCOUNTER — Ambulatory Visit (HOSPITAL_COMMUNITY): Payer: BC Managed Care – PPO | Attending: Cardiovascular Disease

## 2022-08-26 DIAGNOSIS — I77819 Aortic ectasia, unspecified site: Secondary | ICD-10-CM | POA: Insufficient documentation

## 2022-08-26 DIAGNOSIS — I351 Nonrheumatic aortic (valve) insufficiency: Secondary | ICD-10-CM | POA: Diagnosis not present

## 2022-08-26 LAB — ECHOCARDIOGRAM COMPLETE
AV Vena cont: 0.4 cm
Area-P 1/2: 3 cm2
Calc EF: 46.4 %
Est EF: 55
P 1/2 time: 764 msec
S' Lateral: 3.9 cm
Single Plane A2C EF: 49.3 %
Single Plane A4C EF: 48.2 %

## 2022-09-20 ENCOUNTER — Telehealth: Payer: Self-pay

## 2022-09-20 NOTE — Telephone Encounter (Signed)
*  [facility]  PA request received via CMM for Repatha SureClick 140MG /ML auto-injectors  PA submitted to [facility] and is pending additional questions/determination  Key: [id number]

## 2022-09-23 NOTE — Telephone Encounter (Signed)
  Pt is calling to follow up his PA for his repatha. Pt request if he get a call for an update

## 2022-09-23 NOTE — Telephone Encounter (Signed)
Still pending with insurance per CMM.

## 2022-09-27 MED ORDER — REPATHA SURECLICK 140 MG/ML ~~LOC~~ SOAJ: 140.0000 mg | SUBCUTANEOUS | 11 refills | Status: DC

## 2022-09-27 NOTE — Telephone Encounter (Signed)
Received fax with PA approval for Repatha through 09/22/23. Pt aware, refill sent in.

## 2022-10-05 ENCOUNTER — Ambulatory Visit (HOSPITAL_COMMUNITY)
Admission: RE | Admit: 2022-10-05 | Discharge: 2022-10-05 | Disposition: A | Discharge status: Home / Self Care | Payer: BC Managed Care – PPO | Source: Ambulatory Visit | Attending: Cardiovascular Disease | Admitting: Cardiovascular Disease

## 2022-10-05 DIAGNOSIS — I77819 Aortic ectasia, unspecified site: Secondary | ICD-10-CM | POA: Diagnosis not present

## 2022-10-05 DIAGNOSIS — I712 Thoracic aortic aneurysm, without rupture, unspecified: Secondary | ICD-10-CM | POA: Diagnosis not present

## 2022-10-05 MED ORDER — IOHEXOL 350 MG/ML SOLN
75.0000 mL | Freq: Once | INTRAVENOUS | Status: AC | PRN
  Administered 2022-10-05: 75 mL via INTRAVENOUS

## 2022-10-07 ENCOUNTER — Encounter: Payer: Self-pay | Admitting: Cardiovascular Disease

## 2022-10-08 ENCOUNTER — Telehealth: Payer: Self-pay

## 2022-10-08 DIAGNOSIS — I7121 Aneurysm of the ascending aorta, without rupture: Secondary | ICD-10-CM

## 2022-10-08 NOTE — Telephone Encounter (Signed)
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 This is largely due to some asymmetric dilatation of the left and non coronary cusps. Ascending thoracic aortic aneurysm. Recommend semi-annual imaging followup by CTA or MRA and referral to cardiothoracic surgery if not already obtained.  Please refer to [facility] for evaluation

## 2022-10-08 NOTE — Telephone Encounter (Signed)
Patient has viewed results and MD's comments via MyChart and referral has been placed at this time.

## 2022-10-16 ENCOUNTER — Other Ambulatory Visit: Payer: Self-pay | Admitting: Cardiovascular Disease

## 2022-10-18 DIAGNOSIS — H40003 Preglaucoma, unspecified, bilateral: Secondary | ICD-10-CM | POA: Diagnosis not present

## 2022-11-19 DIAGNOSIS — L508 Other urticaria: Secondary | ICD-10-CM | POA: Diagnosis not present

## 2022-11-25 ENCOUNTER — Encounter: Payer: Self-pay | Admitting: Cardiovascular Disease

## 2022-12-01 ENCOUNTER — Institutional Professional Consult (permissible substitution): Payer: BC Managed Care – PPO | Admitting: Surgery

## 2022-12-01 ENCOUNTER — Encounter: Payer: Self-pay | Admitting: Surgery

## 2022-12-01 VITALS — BP 154/100 | HR 68 | Resp 20 | Ht 71.0 in | Wt 206.0 lb

## 2022-12-01 DIAGNOSIS — I7121 Aneurysm of the ascending aorta, without rupture: Secondary | ICD-10-CM

## 2022-12-02 NOTE — Progress Notes (Signed)
Cardiothoracic Surgery Consultation  PCP is [facility] [place] Referring Provider is Nahser, Deloris Ping, MD  Chief Complaint  Patient presents with   Thoracic Aortic Aneurysm    CTA Chest 10/05/22/ ECHO 08/26/22    HPI:  The patient is a 62 year old gentleman with a history of hypertension, hyperlipidemia, probable bicuspid aortic valve with moderate aortic insufficiency, and aortic root and ascending aortic aneurysm who was referred for evaluation of his aneurysmal aorta.  He has been followed by cardiology for many years, initially Dr. Verdis Prime and then Dr. Elease Hashimoto.  He has had echocardiograms dating back to 2008 when he was noted to have partial fusion of the right and noncoronary cusps with mild to moderate aortic insufficiency.  The aortic root was felt to be normal at that time.  He was followed yearly for a while with echocardiogram without significant change and then had a TEE in 2014 showing moderate aortic insufficiency with a 3 leaflet valve.  He had a repeat echocardiogram every year or 2 since that time without significant change.  His most recent echocardiogram on 08/26/2022 showed a calcified aortic valve particularly at the base of the right and noncoronary cusp with mild fusion.  There is moderate aortic insufficiency with an AR vena contract of 0.4 cm and an AI pressure half-time of 764 ms.  LV diastolic diameter was 5.9 cm which was increased compared to the echo in April 2022 when it was 5.5 cm.  Left ventricular ejection fraction was 55%.  His most recent CTA of the chest on 10/05/2022 showed the aortic root to have a diameter of 4.7 cm at the sinus of Valsalva level which was unchanged compared to a scan in 2019.  The ascending aorta had a maximum diameter of 4.1 cm in the proximal arch 3.3 cm.  The distal thoracic aorta measured 2.5 cm.  He continues to feel well without chest pain or shortness of breath.  He denies exertional fatigue.  He has had no  peripheral edema. Past Medical History:  Diagnosis Date   Bicuspid aortic valve    moderate aortic isufficiency   Chest pain    Gastroesophageal reflux disease    Hiatal hernia    History of kidney stones    Hypercholesterolemia    Hyperlipidemia    Hypertension    Kidney stones    Left ventricular hypertrophy    mild    Past Surgical History:  Procedure Laterality Date   CARDIOVASCULAR STRESS TEST  11-23-2005   EF 55%   CATARACT EXTRACTION Bilateral November & December 2013   EXTRACORPOREAL SHOCK WAVE LITHOTRIPSY Left 09/19/2017   Procedure: LEFT EXTRACORPOREAL SHOCK WAVE LITHOTRIPSY (ESWL);  Surgeon: Crista Elliot, MD;  Location: [facility];  Service: Urology;  Laterality: Left;   RETINAL DETACHMENT SURGERY Left 2016   TEE WITHOUT CARDIOVERSION N/A 01/11/2013   Procedure: TRANSESOPHAGEAL ECHOCARDIOGRAM (TEE);  Surgeon: Vesta Mixer, MD;  Location: [facility] ENDOSCOPY;  Service: Cardiovascular;  Laterality: N/A;   US ECHOCARDIOGRAPHY  06-23-2009   Est EF 55-60%    Family History  Problem Relation Age of Onset   Dementia Father    Hypertension Other   No family history of aortic valve or aneurysm disease.  Social History Social History   Tobacco Use   Smoking status: Never   Smokeless tobacco: Never  Vaping Use   Vaping status: Never Used  Substance Use Topics   Alcohol use: No   Drug use: No  Current Outpatient Medications  Medication Sig Dispense Refill   aspirin 81 MG tablet Take 81 mg by mouth at bedtime.     Evolocumab (REPATHA SURECLICK) 140 MG/ML SOAJ Inject 140 mg into the skin every 14 (fourteen) days. 2 mL 11   metoprolol tartrate (LOPRESSOR) 25 MG tablet Take 1 tablet by mouth twice daily. 180 tablet 2   rosuvastatin (CRESTOR) 20 MG tablet Take 20 mg by mouth daily.     No current facility-administered medications for this visit.    Allergies  Allergen Reactions   Crestor [Rosuvastatin]     Possible memory issue when taking Crestor daily at high dose.  Patient is taking crestor is still taking daily.   Hydrochlorothiazide Nausea Only and Other (See Comments)    Made him sick    Review of Systems  Constitutional:  Negative for activity change and fatigue.  HENT:  Positive for hearing loss.   Eyes:        Floaters  Respiratory:  Negative for chest tightness and shortness of breath.   Cardiovascular:  Negative for chest pain, palpitations and leg swelling.  Gastrointestinal: Negative.   Endocrine: Negative.   Genitourinary: Negative.   Musculoskeletal: Negative.   Skin: Negative.   Allergic/Immunologic: Negative.   Neurological:  Negative for dizziness and syncope.  Hematological: Negative.   Psychiatric/Behavioral: Negative.      BP (!) 154/100   Pulse 68   Resp 20   Ht 5\' 11"  (1.803 m)   Wt 206 lb (93.4 kg)   SpO2 95% Comment: RA  BMI 28.73 kg/m  Physical Exam Constitutional:      Appearance: Normal appearance. He is normal weight.  Eyes:     Extraocular Movements: Extraocular movements intact.     Conjunctiva/sclera: Conjunctivae normal.     Pupils: Pupils are equal, round, and reactive to light.  Cardiovascular:     Rate and Rhythm: Normal rate and regular rhythm.     Heart sounds: Normal heart sounds. No murmur heard. Pulmonary:     Effort: Pulmonary effort is normal.     Breath sounds: Normal breath sounds.  Abdominal:     General: There is no distension.     Tenderness: There is no abdominal tenderness.  Musculoskeletal:        General: No swelling.     Cervical back: Normal range of motion and neck supple.  Skin:    General: Skin is warm and dry.  Neurological:     General: No focal deficit present.     Mental Status: He is alert and oriented to person, place, and time.  Psychiatric:        Mood and Affect: Mood normal.        Behavior: Behavior normal.      Diagnostic Tests:  Narrative & Impression  CLINICAL DATA:  Follow-up of aneurysmal disease of the ascending thoracic aorta.   EXAM: CT  ANGIOGRAPHY CHEST WITH CONTRAST   TECHNIQUE: Multidetector CT imaging of the chest was performed using the standard protocol during bolus administration of intravenous contrast. Multiplanar CT image reconstructions and MIPs were obtained to evaluate the vascular anatomy.   RADIATION DOSE REDUCTION: This exam was performed according to the departmental dose-optimization program which includes automated exposure control, adjustment of the mA and/or kV according to patient size and/or use of iterative reconstruction technique.   CONTRAST:  75mL OMNIPAQUE IOHEXOL 350 MG/ML SOLN   COMPARISON:  09/08/2018 and 01/10/2018   FINDINGS: Cardiovascular: Aneurysmal disease of the aortic root  at the level of the sinuses of Valsalva appears stable since 2019 with maximum diameter approximately 4.7 cm. This is largely due to some asymmetric dilatation of the left and non coronary cusps. The ascending thoracic aorta is not dilated with maximum measured diameter of 3.8 cm. The proximal arch measures 3.3 cm and the distal arch 2.8 cm. There is mild stable dilatation of the proximal descending thoracic aorta measuring up to 3.3 cm. The descending thoracic aorta then tapers to a normal caliber of 2.5 cm. No evidence of aortic dissection. Thickening and partial calcification of the aortic valve. Visualized proximal great vessels demonstrate normal patency and normal branching anatomy.   The heart size is normal. Calcified coronary artery plaque again noted in the distribution of the LAD and left circumflex coronary arteries. The right coronary artery is diminutive. No pericardial fluid identified. Central pulmonary arteries are normal in caliber.   Mediastinum/Nodes: No enlarged mediastinal, hilar, or axillary lymph nodes. Thyroid gland, trachea, and esophagus demonstrate no significant findings.   Lungs/Pleura: Stable chronic scarring in the medial right lower lobe. There is no evidence of  pulmonary edema, consolidation, pneumothorax, nodule or pleural fluid.   Upper Abdomen: No acute abnormality.   Musculoskeletal: No chest wall abnormality. No acute or significant osseous findings.   Review of the MIP images confirms the above findings.   IMPRESSION: 1. Stable aneurysmal disease of the aortic root at the level of the sinuses of Valsalva with maximum diameter of 4.7 cm. This is largely due to some asymmetric dilatation of the left and non coronary cusps. Ascending thoracic aortic aneurysm. Recommend semi-annual imaging followup by CTA or MRA and referral to cardiothoracic surgery if not already obtained. This recommendation follows 2010 ACCF/AHA/AATS/ACR/ASA/SCA/SCAI/SIR/STS/SVM Guidelines for the Diagnosis and Management of Patients With Thoracic Aortic Disease. Circulation. 2010; 121: V425-Z563. Aortic aneurysm NOS (ICD10-I71.9) 2. Stable mild dilatation of the proximal descending thoracic aorta measuring up to 3.3 cm. 3. Thickening and partial calcification of the aortic valve. 4. Calcified coronary artery plaque in the distribution of the LAD and left circumflex coronary arteries. The right coronary artery is diminutive. 5. Stable chronic scarring in the medial right lower lobe.   Aortic aneurysm NOS (ICD10-I71.9).     Electronically Signed   By: Irish Lack M.D.   On: 10/07/2022 10:42      ECHOCARDIOGRAM REPORT       Patient Name:   Louis Delgado Date of Exam: 08/26/2022  Medical Rec #:  [id number]       Height:       70.5 in  Accession #:    [id number]      Weight:       206.6 lb  Date of Birth:  [birth year]        BSA:          2.128 m  Patient Age:    62 years        BP:           120/80 mmHg  Patient Gender: M               HR:           49 bpm.  Exam Location:  [address]   Procedure: 2D Echo, Cardiac Doppler and Color Doppler                              MODIFIED REPORT:  This report was modified by Dietrich Pates  MD on 08/26/2022 due to  Complete.   Indications:     Aortic dilatation [facility]) [[id number]]                   Aortic insufficiency [[id number]]    History:         Patient has prior history of Echocardiogram examinations,  most                  recent 07/24/2020. Signs/Symptoms:Chest Pain; Risk                   Factors:Dyslipidemia and Hypertension. Aortic Dilatation.    Sonographer:     Eulah Pont RDCS  Referring Phys:  [id number] NAHSER  Diagnosing Phys: Dietrich Pates MD   IMPRESSIONS     1. Aortic root in this study imeasure 40mm (compared to 46 mm in echo  from 2022).   2. Compared to echo from April 2022, LV EDD is increased, but ESD  unchanged and aortic insufficiency now appears moderate.   3. Left ventricular ejection fraction, by estimation, is 55%. The left  ventricular internal cavity size was moderately dilated. Left ventricular  diastolic parameters are indeterminate.   4. Right ventricular systolic function is normal. The right ventricular  size is normal.   5. The mitral valve is normal in structure. Trivial mitral valve  regurgitation.   6. AV with calcifiications, particularly at base of R and non coronary  cusps with mild fusion. The aortic valve is tricuspid. Aortic valve  regurgitation is moderate. Aortic valve sclerosis/calcification is  present, without any evidence of aortic  stenosis.   7. The inferior vena cava is normal in size with greater than 50%  respiratory variability, suggesting right atrial pressure of 3 mmHg.   FINDINGS   Left Ventricle: Left ventricular ejection fraction, by estimation, is  55%. The left ventricular internal cavity size was moderately dilated.  There is no left ventricular hypertrophy. Left ventricular diastolic  parameters are indeterminate.   Right Ventricle: The right ventricular size is normal. Right vetricular  wall thickness was not assessed. Right ventricular systolic function is  normal.   Left Atrium: Left atrial size was normal in size.    Right Atrium: Right atrial size was normal in size.   Pericardium: Trivial pericardial effusion is present.   Mitral Valve: The mitral valve is normal in structure. Trivial mitral  valve regurgitation.   Tricuspid Valve: The tricuspid valve is normal in structure. Tricuspid  valve regurgitation is trivial.   Aortic Valve: AV with calcifiications, particularly at base of R and non  coronary cusps with mild fusion. The aortic valve is tricuspid. Aortic  valve regurgitation is moderate. Aortic regurgitation PHT measures 764  msec. Aortic valve  sclerosis/calcification is present, without any evidence of aortic  stenosis.   Pulmonic Valve: The pulmonic valve was normal in structure. Pulmonic valve  regurgitation is trivial.   Aorta: The aortic root and ascending aorta are structurally normal, with  no evidence of dilitation.   Venous: The inferior vena cava is normal in size with greater than 50%  respiratory variability, suggesting right atrial pressure of 3 mmHg.   IAS/Shunts: No atrial level shunt detected by color flow Doppler.     LEFT VENTRICLE  PLAX 2D  LVIDd:         5.90 cm      Diastology  LVIDs:         3.90 cm      LV  e' medial:    5.43 cm/s  LV PW:         0.90 cm      LV E/e' medial:  11.0  LV IVS:        0.90 cm      LV e' lateral:   6.66 cm/s  LVOT diam:     2.40 cm      LV E/e' lateral: 9.0  LV SV:         88  LV SV Index:   41  LVOT Area:     4.52 cm    LV Volumes (MOD)  LV vol d, MOD A2C: 115.0 ml  LV vol d, MOD A4C: 127.0 ml  LV vol s, MOD A2C: 58.3 ml  LV vol s, MOD A4C: 65.8 ml  LV SV MOD A2C:     56.7 ml  LV SV MOD A4C:     127.0 ml  LV SV MOD BP:      56.7 ml   RIGHT VENTRICLE  RV S prime:     8.59 cm/s  TAPSE (M-mode): 1.8 cm   LEFT ATRIUM             Index        RIGHT ATRIUM           Index  LA diam:        3.90 cm 1.83 cm/m   RA Area:     10.10 cm  LA Vol (A2C):   36.1 ml 16.97 ml/m  RA Volume:   17.50 ml  8.23 ml/m  LA Vol  (A4C):   39.3 ml 18.47 ml/m  LA Biplane Vol: 37.9 ml 17.81 ml/m   AORTIC VALVE  LVOT Vmax:         72.10 cm/s  LVOT Vmean:        48.900 cm/s  LVOT VTI:          0.195 m  AI PHT:            764 msec  AR Vena Contracta: 0.40 cm    AORTA  Ao Root diam: 4.00 cm  Ao Asc diam:  4.10 cm   MITRAL VALVE  MV Area (PHT): 3.00 cm    SHUNTS  MV Decel Time: 253 msec    Systemic VTI:  0.20 m  MV E velocity: 59.90 cm/s  Systemic Diam: 2.40 cm  MV A velocity: 71.60 cm/s  MV E/A ratio:  0.84   Dietrich Pates MD  Electronically signed by Dietrich Pates MD  Signature Date/Time: 08/26/2022/11:23:50 AM        Final (Updated)       Impression:  This 62 year old gentleman has a 4.7 cm aortic root aneurysm with mild aneurysmal enlargement of the ascending aorta at 4.1 cm.  He has a probable functionally bicuspid aortic valve with partial fusion of the right and noncoronary cusps with calcification in this area and moderate aortic insufficiency.  I do not think this is changed much from his previous echocardiogram.  His LV diastolic dimension has gradually increased over time and is presently 5.9 cm with a systolic diameter 3.9 cm.  His aortic root and ascending aorta are still well below the surgical threshold of 5.5 cm and have been stable over the past several years.  His aortic insufficiency appears stable in the moderate range and is asymptomatic.  There is currently no indication for aortic valve replacement although he will require continued close follow-up with echocardiogram given the  increase in his LV diastolic diameter over time.  I reviewed the CTA and echo studies in detail with him including the indications for surgical treatment and follow-up recommendations.  I told him that he should have a follow-up CTA of the chest/aorta with gating in 1 year and should have follow-up echocardiograms every 6 months to watch for continued LV dilatation or any decrease in LV systolic function.  I discussed the  symptoms of aortic insufficiency with him and told him to continue watching for them as he exercises.  I discussed the importance of continued good blood pressure control in preventing further enlargement of his aorta and acute aortic dissection.  Plan:  I will see him back in June 2025 with a CTA of the chest/aorta with gating.  He will continue to follow-up with Dr. Elease Hashimoto concerning his aortic insufficiency and should have follow-up echocardiograms done every 6 months.  I spent 60 minutes performing this consultation and > 50% of this time was spent face to face counseling and coordinating the care of this patient's aortic root and ascending aortic aneurysm and aortic insufficiency.   Alleen Borne, MD [facility] [phone]

## 2022-12-20 ENCOUNTER — Encounter: Payer: Self-pay | Admitting: Cardiovascular Disease

## 2022-12-20 DIAGNOSIS — I351 Nonrheumatic aortic (valve) insufficiency: Secondary | ICD-10-CM

## 2023-01-31 DIAGNOSIS — H40003 Preglaucoma, unspecified, bilateral: Secondary | ICD-10-CM | POA: Diagnosis not present

## 2023-02-17 ENCOUNTER — Encounter: Payer: Self-pay | Admitting: Cardiovascular Disease

## 2023-02-18 ENCOUNTER — Ambulatory Visit (HOSPITAL_COMMUNITY): Payer: BC Managed Care – PPO | Attending: Cardiovascular Disease

## 2023-02-18 DIAGNOSIS — I351 Nonrheumatic aortic (valve) insufficiency: Secondary | ICD-10-CM | POA: Diagnosis not present

## 2023-02-18 LAB — ECHOCARDIOGRAM COMPLETE
Area-P 1/2: 1.57 cm2
P 1/2 time: 652 ms
S' Lateral: 3.7 cm

## 2023-02-18 MED ORDER — PERFLUTREN LIPID MICROSPHERE
1.0000 mL | INTRAVENOUS | Status: AC | PRN
Start: 2023-02-18 — End: 2023-02-18
  Administered 2023-02-18: 2 mL via INTRAVENOUS

## 2023-02-21 ENCOUNTER — Telehealth: Payer: Self-pay

## 2023-02-21 DIAGNOSIS — Z01810 Encounter for preprocedural cardiovascular examination: Secondary | ICD-10-CM

## 2023-02-21 DIAGNOSIS — I7121 Aneurysm of the ascending aorta, without rupture: Secondary | ICD-10-CM

## 2023-02-21 NOTE — Telephone Encounter (Signed)
-----   Message from Kristeen Miss sent at 02/20/2023  8:17 AM EST ----- Normal LVEF 55-60% Grade I DD Mild - moderate AI Trivial MR  Moderate dilatation of his aortic root 46mm Mild dilatation of ascending aorta at 42 mm  Trivial PI  Echo has overestimated the size of his aorta in previous studies   He had CTA of his aorta in June, 2024 Please repeat CTA of his aorta in  Dec. 2024

## 2023-02-21 NOTE — Telephone Encounter (Signed)
Pt viewed MD's comments. Repeat CTA Aorta order placed at this time, along with BMET order.

## 2023-02-23 ENCOUNTER — Telehealth: Payer: Self-pay | Admitting: *Deleted

## 2023-02-23 NOTE — Telephone Encounter (Signed)
   Pre-operative Risk Assessment    Patient Name: Louis Delgado  DOB: May 03, 1960 MRN: [id number]  DATE OF LAST VISIT: 07/29/22 DR. NAHSER DATE OF NEXT VISIT: NONE   Request for Surgical Clearance    Procedure:   SOFT TISSUE GRAFT; INVOLVES GRAFT HARVESTED FROM THE ROOF OF THE MOUTH AND TRANSPLANTED TO THE RECIPIENT SITE  Date of Surgery:  Clearance TBD                                 Surgeon:  DR. Pilar Plate, DDS Surgeon's Group or Practice Name:  [facility] Phone number:  [phone] Fax number:  [phone]   Type of Clearance Requested:   - Medical ; ASA    Type of Anesthesia:  Local  USING 2% LIDOCAINE 1:100KEPI AND 0.5% MARCAINE 1:200KEPI   Additional requests/questions:    Elpidio Anis   02/23/2023, 5:28 PM

## 2023-02-23 NOTE — Telephone Encounter (Signed)
   Name: Louis Delgado  DOB: Nov 04, 1960  MRN: [id number]  Primary Cardiologist: None   Preoperative team, please contact this patient and set up a phone call appointment for further preoperative risk assessment. Please obtain consent and complete medication review. Thank you for your help.  I confirm that guidance regarding antiplatelet and oral anticoagulation therapy has been completed and, if necessary, noted below.  Patient can hold ASA 81 mg 5 to 7 days prior to procedure and should restart postprocedure when hemostasis is achieved.  I also confirmed the patient resides in the state of [place]. As per [facility] Medical Board telemedicine laws, the patient must reside in the state in which the provider is licensed.   Napoleon Form, Leodis Rains, NP 02/23/2023, 5:58 PM [facility]

## 2023-02-24 ENCOUNTER — Telehealth: Payer: Self-pay

## 2023-02-24 NOTE — Telephone Encounter (Signed)
  Patient Consent for Virtual Visit         Louis Delgado has provided verbal consent on 02/24/2023 for a virtual visit (video or telephone).   CONSENT FOR VIRTUAL VISIT FOR:  Louis Delgado  By participating in this virtual visit I agree to the following:  I hereby voluntarily request, consent and authorize [facility] and its employed or contracted physicians, physician assistants, nurse practitioners or other licensed health care professionals (the Practitioner), to provide me with telemedicine health care services (the "Services") as deemed necessary by the treating Practitioner. I acknowledge and consent to receive the Services by the Practitioner via telemedicine. I understand that the telemedicine visit will involve communicating with the Practitioner through live audiovisual communication technology and the disclosure of certain medical information by electronic transmission. I acknowledge that I have been given the opportunity to request an in-person assessment or other available alternative prior to the telemedicine visit and am voluntarily participating in the telemedicine visit.  I understand that I have the right to withhold or withdraw my consent to the use of telemedicine in the course of my care at any time, without affecting my right to future care or treatment, and that the Practitioner or I may terminate the telemedicine visit at any time. I understand that I have the right to inspect all information obtained and/or recorded in the course of the telemedicine visit and may receive copies of available information for a reasonable fee.  I understand that some of the potential risks of receiving the Services via telemedicine include:  Delay or interruption in medical evaluation due to technological equipment failure or disruption; Information transmitted may not be sufficient (e.g. poor resolution of images) to allow for appropriate medical decision making by  the Practitioner; and/or  In rare instances, security protocols could fail, causing a breach of personal health information.  Furthermore, I acknowledge that it is my responsibility to provide information about my medical history, conditions and care that is complete and accurate to the best of my ability. I acknowledge that Practitioner's advice, recommendations, and/or decision may be based on factors not within their control, such as incomplete or inaccurate data provided by me or distortions of diagnostic images or specimens that may result from electronic transmissions. I understand that the practice of medicine is not an exact science and that Practitioner makes no warranties or guarantees regarding treatment outcomes. I acknowledge that a copy of this consent can be made available to me via my patient portal [facility] MyChart), or I can request a printed copy by calling the office of [facility].    I understand that my insurance will be billed for this visit.   I have read or had this consent read to me. I understand the contents of this consent, which adequately explains the benefits and risks of the Services being provided via telemedicine.  I have been provided ample opportunity to ask questions regarding this consent and the Services and have had my questions answered to my satisfaction. I give my informed consent for the services to be provided through the use of telemedicine in my medical care

## 2023-02-24 NOTE — Telephone Encounter (Signed)
Patient scheduled for tele visit on 03/14/23. Med rec and consent done

## 2023-03-03 DIAGNOSIS — Z01810 Encounter for preprocedural cardiovascular examination: Secondary | ICD-10-CM | POA: Diagnosis not present

## 2023-03-04 ENCOUNTER — Ambulatory Visit (HOSPITAL_COMMUNITY)
Admission: RE | Admit: 2023-03-04 | Discharge: 2023-03-04 | Disposition: A | Discharge status: Home / Self Care | Payer: BC Managed Care – PPO | Source: Ambulatory Visit | Attending: Cardiovascular Disease | Admitting: Cardiovascular Disease

## 2023-03-04 ENCOUNTER — Encounter (HOSPITAL_COMMUNITY): Payer: Self-pay

## 2023-03-04 DIAGNOSIS — I7121 Aneurysm of the ascending aorta, without rupture: Secondary | ICD-10-CM

## 2023-03-04 LAB — BASIC METABOLIC PANEL
BUN/Creatinine Ratio: 16 (ref 10–24)
BUN: 16 mg/dL (ref 8–27)
CO2: 28 mmol/L (ref 20–29)
Calcium: 9.6 mg/dL (ref 8.6–10.2)
Chloride: 104 mmol/L (ref 96–106)
Creatinine, Ser: 0.97 mg/dL (ref 0.76–1.27)
Glucose: 93 mg/dL (ref 70–99)
Potassium: 5.2 mmol/L (ref 3.5–5.2)
Sodium: 143 mmol/L (ref 134–144)
eGFR: 88 mL/min/{1.73_m2} (ref >59)

## 2023-03-10 DIAGNOSIS — I10 Essential (primary) hypertension: Secondary | ICD-10-CM | POA: Diagnosis not present

## 2023-03-10 DIAGNOSIS — L508 Other urticaria: Secondary | ICD-10-CM | POA: Diagnosis not present

## 2023-03-14 ENCOUNTER — Ambulatory Visit: Payer: BC Managed Care – PPO | Attending: Physician Assistant

## 2023-03-14 DIAGNOSIS — Z01810 Encounter for preprocedural cardiovascular examination: Secondary | ICD-10-CM

## 2023-03-14 NOTE — Progress Notes (Signed)
Virtual Visit via Telephone Note   Because of Louis Delgado's co-morbid illnesses, he is at least at moderate risk for complications without adequate follow up.  This format is felt to be most appropriate for this patient at this time.  The patient did not have access to video technology/had technical difficulties with video requiring transitioning to audio format only (telephone).  All issues noted in this document were discussed and addressed.  No physical exam could be performed with this format.  Please refer to the patient's chart for his consent to telehealth for [facility].  Evaluation Performed:  Preoperative cardiovascular risk assessment _____________   Date:  03/14/2023   Patient ID:  Louis Delgado, DOB 1960/04/22, MRN [id number] Patient Location:  Home Provider location:   Office  Primary Care Provider:  [facility] [place] Primary Cardiologist:  None  Chief Complaint / Patient Profile   62 y.o. y/o male with a h/o hypertension, hyperlipidemia, aortic aneurysm and aortic insufficiency who is pending soft tissue graft on the roof of his mouth and presents today for telephonic preoperative cardiovascular risk assessment.  History of Present Illness    Louis Delgado is a 62 y.o. male who presents via audio/video conferencing for a telehealth visit today.  Pt was last seen in cardiology clinic on 07/29/22 by Dr. Elease Hashimoto.  At that time Louis Delgado was doing well.  The patient is now pending procedure as outlined above. Since his last visit, he tells me the biggest issue has been BP. Mild left coronary artery calcifications on CTA back in 2020. No chest pains or SOB. He likes to workout at [facility] a few days a week and has been doing this for 3 years. Hiking from time to time. He has kept a BP log and he presents it at his office visits. He does meet minimum mets.  Patient can hold ASA 81 mg 5 to 7 days prior to  procedure and should restart postprocedure when hemostasis is achieved.     Past Medical History    Past Medical History:  Diagnosis Date   Bicuspid aortic valve    moderate aortic isufficiency   Chest pain    Gastroesophageal reflux disease    Hiatal hernia    History of kidney stones    Hypercholesterolemia    Hyperlipidemia    Hypertension    Kidney stones    Left ventricular hypertrophy    mild   Past Surgical History:  Procedure Laterality Date   CARDIOVASCULAR STRESS TEST  11-23-2005   EF 55%   CATARACT EXTRACTION Bilateral November & December 2013   EXTRACORPOREAL SHOCK WAVE LITHOTRIPSY Left 09/19/2017   Procedure: LEFT EXTRACORPOREAL SHOCK WAVE LITHOTRIPSY (ESWL);  Surgeon: Crista Elliot, MD;  Location: [facility];  Service: Urology;  Laterality: Left;   RETINAL DETACHMENT SURGERY Left 2016   TEE WITHOUT CARDIOVERSION N/A 01/11/2013   Procedure: TRANSESOPHAGEAL ECHOCARDIOGRAM (TEE);  Surgeon: Vesta Mixer, MD;  Location: [facility] ENDOSCOPY;  Service: Cardiovascular;  Laterality: N/A;   US ECHOCARDIOGRAPHY  06-23-2009   Est EF 55-60%    Allergies  Allergies  Allergen Reactions   Crestor [Rosuvastatin]     Possible memory issue when taking Crestor daily at high dose. Patient is taking crestor is still taking daily.   Hydrochlorothiazide Nausea Only and Other (See Comments)    Made him sick    Home Medications    Prior to Admission medications   Medication Sig Start  Date End Date Taking? Authorizing Provider  aspirin 81 MG tablet Take 81 mg by mouth at bedtime.    [provider]  Evolocumab (REPATHA SURECLICK) 140 MG/ML SOAJ Inject 140 mg into the skin every 14 (fourteen) days. 09/27/22   Nahser, Deloris Ping, MD  metoprolol tartrate (LOPRESSOR) 25 MG tablet Take 1 tablet by mouth twice daily. 10/18/22   Nahser, Deloris Ping, MD  rosuvastatin (CRESTOR) 20 MG tablet Take 20 mg by mouth daily.    [provider]    Physical Exam    Vital Signs:  Louis Delgado does not have vital signs available for review today.  BP readings: 120/80 at office visit 07/29/22  Given telephonic nature of communication, physical exam is limited. AAOx3. NAD. Normal affect.  Speech and respirations are unlabored.  Accessory Clinical Findings    None  Assessment & Plan    1.  Preoperative Cardiovascular Risk Assessment:  Louis Delgado perioperative risk of a major cardiac event is 0.9% according to the Revised Cardiac Risk Index (RCRI).  Therefore, he is at low risk for perioperative complications.   His functional capacity is good at 5.62 METs according to the Duke Activity Status Index (DASI). Recommendations: According to ACC/AHA guidelines, no further cardiovascular testing needed.  The patient may proceed to surgery at acceptable risk.   Antiplatelet and/or Anticoagulation Recommendations: Aspirin can be held for 5-7 days prior to his surgery.  Please resume Aspirin post operatively when it is felt to be safe from a bleeding standpoint.   The patient was advised that if he develops new symptoms prior to surgery to contact our office to arrange for a follow-up visit, and he verbalized understanding.   A copy of this note will be routed to requesting surgeon.  Time:   Today, I have spent 20 minutes with the patient with telehealth technology discussing medical history, symptoms, and management plan.     Sharlene Dory, PA-C  03/14/2023, 9:40 AM

## 2023-03-15 ENCOUNTER — Ambulatory Visit (HOSPITAL_COMMUNITY)
Admission: RE | Admit: 2023-03-15 | Discharge: 2023-03-15 | Disposition: A | Discharge status: Home / Self Care | Payer: BC Managed Care – PPO | Source: Ambulatory Visit | Attending: Cardiovascular Disease | Admitting: Cardiovascular Disease

## 2023-03-15 DIAGNOSIS — Q245 Malformation of coronary vessels: Secondary | ICD-10-CM | POA: Diagnosis not present

## 2023-03-15 DIAGNOSIS — I7121 Aneurysm of the ascending aorta, without rupture: Secondary | ICD-10-CM | POA: Insufficient documentation

## 2023-03-15 DIAGNOSIS — I2510 Atherosclerotic heart disease of native coronary artery without angina pectoris: Secondary | ICD-10-CM | POA: Diagnosis not present

## 2023-03-15 MED ORDER — IOHEXOL 350 MG/ML SOLN
75.0000 mL | Freq: Once | INTRAVENOUS | Status: AC | PRN
  Administered 2023-03-15: 75 mL via INTRAVENOUS

## 2023-03-24 ENCOUNTER — Telehealth: Payer: Self-pay

## 2023-03-24 DIAGNOSIS — I77819 Aortic ectasia, unspecified site: Secondary | ICD-10-CM

## 2023-03-24 NOTE — Telephone Encounter (Signed)
Patient has viewed MD's comments via MyChart. Order placed for MRA to be done next year.

## 2023-03-24 NOTE — Telephone Encounter (Signed)
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   41 mm compared to 40 mm from the study 6 months ago   No significant changes Recommend  MR angio  of the aorta in 1 year

## 2023-04-04 DIAGNOSIS — H9193 Unspecified hearing loss, bilateral: Secondary | ICD-10-CM | POA: Diagnosis not present

## 2023-04-04 DIAGNOSIS — Z0110 Encounter for examination of ears and hearing without abnormal findings: Secondary | ICD-10-CM | POA: Diagnosis not present

## 2023-04-04 DIAGNOSIS — H903 Sensorineural hearing loss, bilateral: Secondary | ICD-10-CM | POA: Diagnosis not present

## 2023-04-04 DIAGNOSIS — H6123 Impacted cerumen, bilateral: Secondary | ICD-10-CM | POA: Diagnosis not present

## 2023-05-27 ENCOUNTER — Other Ambulatory Visit: Payer: Self-pay | Admitting: Cardiovascular Disease

## 2023-06-14 DIAGNOSIS — S0031XA Abrasion of nose, initial encounter: Secondary | ICD-10-CM | POA: Diagnosis not present

## 2023-06-20 DIAGNOSIS — I10 Essential (primary) hypertension: Secondary | ICD-10-CM | POA: Diagnosis not present

## 2023-06-20 DIAGNOSIS — E785 Hyperlipidemia, unspecified: Secondary | ICD-10-CM | POA: Diagnosis not present

## 2023-06-20 DIAGNOSIS — Z125 Encounter for screening for malignant neoplasm of prostate: Secondary | ICD-10-CM | POA: Diagnosis not present

## 2023-06-29 DIAGNOSIS — I10 Essential (primary) hypertension: Secondary | ICD-10-CM | POA: Diagnosis not present

## 2023-06-29 DIAGNOSIS — E785 Hyperlipidemia, unspecified: Secondary | ICD-10-CM | POA: Diagnosis not present

## 2023-06-29 DIAGNOSIS — Z0000 Encounter for general adult medical examination without abnormal findings: Secondary | ICD-10-CM | POA: Diagnosis not present

## 2023-07-07 DIAGNOSIS — J069 Acute upper respiratory infection, unspecified: Secondary | ICD-10-CM | POA: Diagnosis not present

## 2023-07-18 ENCOUNTER — Telehealth: Payer: Self-pay | Admitting: Cardiovascular Disease

## 2023-07-18 MED ORDER — ROSUVASTATIN CALCIUM 20 MG PO TABS: 20.0000 mg | ORAL_TABLET | Freq: Every day | ORAL | 0 refills | Status: AC

## 2023-07-18 NOTE — Telephone Encounter (Signed)
 Pt's medication was sent to pt's pharmacy as requested. Confirmation received.

## 2023-07-18 NOTE — Telephone Encounter (Signed)
*  STAT* If patient is at the pharmacy, call can be transferred to refill team.   1. Which medications need to be refilled? (please list name of each medication and dose if known) rosuvastatin (CRESTOR) 20 MG tablet    2. Would you like to learn more about the convenience, safety, & potential cost savings by using the [facility]? No   3. Are you open to using the [facility] (Type [facility]. ). No   4. Which pharmacy/location (including street and city if local pharmacy) is medication to be sent to? [id number] - [place], [place] - [address]   5. Do they need a 30 day or 90 day supply? 90 day

## 2023-07-24 ENCOUNTER — Encounter: Payer: Self-pay | Admitting: Cardiovascular Disease

## 2023-07-24 NOTE — Progress Notes (Unsigned)
 Louis Delgado Date of Birth  12/17/1960       [facility]    [place] [address]  [address] [place], [place]  [address]   [place], [place]  [address] [phone]     [phone]   Fax  [phone]    Fax [phone]  Problem List: 1. Hypertension 2. Hyperlipidemia 3.  Aortic insufficiency   Previous notes Louis Delgado is a 63 y.o. gentleman with a hx of a  aortic insufficiency, hypertension, and hyperlipidemia. He's done well from a cardiac standpoint. He's not had any episodes of chest pain or shortness of breath. He has not been exercising quite as much as he would like but is looking forward to getting back into his exercise regimen.  He has not been getting as much exercise that he would like and he has gained some weight.   He has gained about 5 pounds.   He is otherwise feeling well.  Feb. 27, 2014 Louis Delgado is doing well from a cardiac standpoint.  He has been having some tingling and numbness in his fingers and toes.    It typically resolved if he moves his hands around.  No CP or dyspnea.  November 27, 2012:  Louis Delgado is doing well. He's having any episodes of chest pain or shortness breath. He has bicuspid aortic valve/aortic insufficiency that we have been following. He's not having any symptoms related to the aortic insufficiency. His blood pressure and heart rate at been well-controlled.  Feb. 11, 2015:  Louis Delgado is doing OK.   His TEE showed moderate AI but a 3 leaflet aortic valve.    Getting out and exercising.   His recent labs showed good lipid levels.   October 18, 2014:  Louis Delgado is seen back today for follow up of his aortic insufficiency . Has been seen in the lipid clinic - lipids have been ok Has had a detached retina and has not been exercising as much this past spring. No CP , no dyspnea  August 05, 2015  Louis Delgado is seen back today  Doing well Had an episode of blepharitis  Thinks that he may need another sleep study. Snores  loudly , Had a sleep study 7-8 years ago - had borderline OSA at that time.  More fatigued recently.   Has an AM headache frequently  Exercising several times a week .   Sep 06, 2016:  Louis Delgado is seen today for follow up  Overall feels well Exercising several times a week .     December 16, 2017: Louis Delgado  seen today for follow-up visit.  He has a history of hypertension and mild aortic insufficiency. Feeling well Had a kidney stone this past summer  Has lithotripsy  CT scan during his work up showed coronary calcifications.  He remains asymptomatic and has not had any episodes of chest pain. He is on crestor 5 mg just 4 times a week LDL was 91 yesterday   Noted that 1 of his EKGs revealed nonspecific interventricular conduction delay. We discussed the benign nature of this.  June 27, 2019:  Louis Delgado is seen today for follow-up of his coronary artery calcifications, hyperlipidemia , aortic insufficiency.  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  This mild dilatation has remained unchanged for years.  Recent labs look stable.  His basic metabolic profile reveals a creatinine of 1.01.  His  potassium is 4.6.  Total cholesterol is 147.  HDL is 52.  Triglyceride levels 100.  LDL 77.   Echocardiogram in May, 2020 reveals normal left ventricular systolic function.  He has mild (grade 1) diastolic dysfunction.  He has mild aortic insufficiency.  The ascending aorta was thought to be mildly dilated and measured 40 mm. Exercising some. No DOE .   No fatigue.  No cough No fever   June 27, 2020:  Type seen today for follow-up of his coronary artery calcifications, hyperlipidemia, aortic insufficiency.  He has a minimally dilated ascending aorta measuring 3.8 x 3.8 by CT angiogram in May, 2020.  His aortic size has remained unchanged.  Has joined [employer] Working out , has losing weight .  Is on rosuvastatin 10 mg a day   He has cut out red  meat. Fried foods, Desserts Sweet tea  Soda   July 30, 2021: Louis Delgado is seen today for follow-up of his coronary artery calcifications, hyperlipidemia, aortic insufficiency.  He has a minimally dilated ascending aorta. He has a trileaflet aortic valve   Working out at [employer]  2-3 times a week , no angina  Labs from Dr. Nash Dimmer office were reviewed LDL is 82, trigs are 119  He is currently on Rosuvastatin 20 mg a day  Coronary calcium score of 210. This was 24 percentile for age and sex matched control. No immediate family hx of CAD Will refer to our   July 29, 2022  Louis Delgado is seen for follow up of his coronary calcification HLD, minimally dilated aorta,  Has aortic insufficiency ( has a 3 leaflet valve )  Working out at [employer], [employer] 3 miles ,works out on machines regularly   No CP. No dyspnea  Is on Repatha -  LDL is 9  Doing well    July 25, 2023 Louis Delgado is seen for follow up of his HLD, HTN, aortic insufficiency   Current Outpatient Medications on File Prior to Visit  Medication Sig Dispense Refill   aspirin 81 MG tablet Take 81 mg by mouth at bedtime.     Evolocumab (REPATHA SURECLICK) 140 MG/ML SOAJ Inject 140 mg into the skin every 14 (fourteen) days. 2 mL 11   metoprolol tartrate (LOPRESSOR) 25 MG tablet Take 1 tablet by mouth twice daily. 180 tablet 0   rosuvastatin (CRESTOR) 20 MG tablet Take 1 tablet (20 mg total) by mouth daily. 90 tablet 0   No current facility-administered medications on file prior to visit.    Allergies  Allergen Reactions   Crestor [Rosuvastatin]     Possible memory issue when taking Crestor daily at high dose. Patient is taking crestor is still taking daily.   Hydrochlorothiazide Nausea Only and Other (See Comments)    Made him sick    Past Medical History:  Diagnosis Date   Bicuspid aortic valve    moderate aortic isufficiency   Chest pain    Gastroesophageal reflux disease    Hiatal hernia    History of kidney stones     Hypercholesterolemia    Hyperlipidemia    Hypertension    Kidney stones    Left ventricular hypertrophy    mild    Past Surgical History:  Procedure Laterality Date   CARDIOVASCULAR STRESS TEST  11-23-2005   EF 55%   CATARACT EXTRACTION Bilateral November & December 2013   EXTRACORPOREAL SHOCK WAVE LITHOTRIPSY Left 09/19/2017   Procedure: LEFT EXTRACORPOREAL SHOCK WAVE LITHOTRIPSY (ESWL);  Surgeon: Crista Elliot, MD;  Location: [facility];  Service: Urology;  Laterality: Left;   RETINAL DETACHMENT SURGERY Left 2016   TEE WITHOUT CARDIOVERSION N/A 01/11/2013   Procedure: TRANSESOPHAGEAL ECHOCARDIOGRAM (TEE);  Surgeon: Vesta Mixer, MD;  Location: [facility] ENDOSCOPY;  Service: Cardiovascular;  Laterality: N/A;   US ECHOCARDIOGRAPHY  06-23-2009   Est EF 55-60%    Social History   Tobacco Use  Smoking Status Never  Smokeless Tobacco Never    Social History   Substance and Sexual Activity  Alcohol Use No    Family History  Problem Relation Age of Onset   Dementia Father    Hypertension Other     Reviw of Systems:  Reviewed in the HPI.  All other systems are negative.   Physical Exam: There were no vitals taken for this visit.  No BP recorded.  {Refresh Note OR Click here to enter BP  :1}***    GEN:  Well nourished, well developed in no acute distress HEENT: Normal NECK: No JVD; No carotid bruits LYMPHATICS: No lymphadenopathy CARDIAC: RRR ***, no murmurs, rubs, gallops RESPIRATORY:  Clear to auscultation without rales, wheezing or rhonchi  ABDOMEN: Soft, non-tender, non-distended MUSCULOSKELETAL:  No edema; No deformity  SKIN: Warm and dry NEUROLOGIC:  Alert and oriented x 3       ECG:           Assessment / Plan:   1. Hypertension -     2. Hyperlipidemia -        3.  Aortic insufficiency -     4.  Mild aortic dilatation:   His aortic dilatation appears to be stable.  The aorta measured 3.8 x 3.8 by CT angiogram in May, 2020.  Echo      Kristeen Miss, MD  07/24/2023 7:16 AM    [facility] [address] [place],  [address] [place], [place]  [address] Pager [phone] Phone: [phone]; Fax: [phone]

## 2023-07-25 ENCOUNTER — Ambulatory Visit: Payer: BC Managed Care – PPO | Attending: Cardiovascular Disease | Admitting: Cardiovascular Disease

## 2023-07-25 ENCOUNTER — Encounter: Payer: Self-pay | Admitting: Cardiovascular Disease

## 2023-07-25 VITALS — BP 136/88 | HR 56 | Resp 18 | Ht 70.0 in | Wt 207.2 lb

## 2023-07-25 DIAGNOSIS — I351 Nonrheumatic aortic (valve) insufficiency: Secondary | ICD-10-CM

## 2023-07-25 DIAGNOSIS — E782 Mixed hyperlipidemia: Secondary | ICD-10-CM | POA: Diagnosis not present

## 2023-07-25 DIAGNOSIS — I10 Essential (primary) hypertension: Secondary | ICD-10-CM

## 2023-07-25 NOTE — Patient Instructions (Signed)
 Medication Instructions:  Your physician recommends that you continue on your current medications as directed. Please refer to the Current Medication list given to you today. *If you need a refill on your cardiac medications before your next appointment, please call your pharmacy*   Follow-Up: At [facility], you and your health needs are our priority.  As part of our continuing mission to provide you with exceptional heart care, our providers are all part of one team.  This team includes your primary Cardiologist (physician) and Advanced Practice Providers or APPs (Physician Assistants and Nurse Practitioners) who all work together to provide you with the care you need, when you need it.  Your next appointment:   1 year(s)  Provider:   Dr Elease Hashimoto  We recommend signing up for the patient portal called "MyChart".  Sign up information is provided on this After Visit Summary.  MyChart is used to connect with patients for Virtual Visits (Telemedicine).  Patients are able to view lab/test results, encounter notes, upcoming appointments, etc.  Non-urgent messages can be sent to your provider as well.   To learn more about what you can do with MyChart, go to [email or web address].        1st Floor: - Lobby - Registration  - Pharmacy  - Lab - Cafe  2nd Floor: - PV Lab - Diagnostic Testing (echo, CT, nuclear med)  3rd Floor: - Vacant  4th Floor: - TCTS (cardiothoracic surgery) - AFib Clinic - Structural Heart Clinic - Vascular Surgery  - Vascular Ultrasound  5th Floor: - HeartCare Cardiology (general and EP) - Clinical Pharmacy for coumadin, hypertension, lipid, weight-loss medications, and med management appointments    Valet parking services will be available as well.

## 2023-08-05 ENCOUNTER — Other Ambulatory Visit: Payer: Self-pay | Admitting: Cardiovascular Disease

## 2023-08-29 ENCOUNTER — Other Ambulatory Visit: Payer: Self-pay | Admitting: Surgery

## 2023-08-29 DIAGNOSIS — I7121 Aneurysm of the ascending aorta, without rupture: Secondary | ICD-10-CM

## 2023-09-24 ENCOUNTER — Encounter (HOSPITAL_BASED_OUTPATIENT_CLINIC_OR_DEPARTMENT_OTHER): Payer: Self-pay | Admitting: *Deleted

## 2023-09-24 ENCOUNTER — Emergency Department (HOSPITAL_BASED_OUTPATIENT_CLINIC_OR_DEPARTMENT_OTHER)

## 2023-09-24 ENCOUNTER — Other Ambulatory Visit: Payer: Self-pay

## 2023-09-24 ENCOUNTER — Emergency Department (HOSPITAL_BASED_OUTPATIENT_CLINIC_OR_DEPARTMENT_OTHER)
Admission: EM | Admit: 2023-09-24 | Discharge: 2023-09-25 | Disposition: A | Discharge status: Home / Self Care | Attending: Emergency Medicine | Admitting: Emergency Medicine

## 2023-09-24 DIAGNOSIS — Y93G1 Activity, food preparation and clean up: Secondary | ICD-10-CM | POA: Insufficient documentation

## 2023-09-24 DIAGNOSIS — W25XXXA Contact with sharp glass, initial encounter: Secondary | ICD-10-CM | POA: Insufficient documentation

## 2023-09-24 DIAGNOSIS — I10 Essential (primary) hypertension: Secondary | ICD-10-CM | POA: Diagnosis not present

## 2023-09-24 DIAGNOSIS — S61216A Laceration without foreign body of right little finger without damage to nail, initial encounter: Secondary | ICD-10-CM | POA: Insufficient documentation

## 2023-09-24 DIAGNOSIS — Z79899 Other long term (current) drug therapy: Secondary | ICD-10-CM | POA: Diagnosis not present

## 2023-09-24 DIAGNOSIS — S6991XA Unspecified injury of right wrist, hand and finger(s), initial encounter: Secondary | ICD-10-CM | POA: Diagnosis not present

## 2023-09-24 DIAGNOSIS — Z7982 Long term (current) use of aspirin: Secondary | ICD-10-CM | POA: Insufficient documentation

## 2023-09-24 NOTE — ED Triage Notes (Signed)
 Laceration to right pinky from a broken wine glass. Bleeding constant for the past 30 minutes when pressure is not applied. Bleeding is not pulsating. New dressing applied.

## 2023-09-25 NOTE — Discharge Instructions (Signed)
 Do not let your laceration (cut) get wet for the next 48 hours. After that you may allow soapy water to drain down the wound to clean it.  Please do not scrub.  Do not submerge the wound under water for the next 2 weeks.   To minimize scarring, you can apply a vaseline based ointment for the next 2 weeks and keep it out of direct sun light. After that, you may apply sunscreen for the next several months.   Your strip will fall off on it's own in 7-10 days.   Return if your wound appears to be infected (see laceration care instructions).

## 2023-09-25 NOTE — ED Notes (Signed)
 RN reviewed discharge instructions with pt. Pt verbalized understanding and had no further questions

## 2023-09-25 NOTE — ED Provider Notes (Signed)
 [place] EMERGENCY DEPARTMENT AT [facility] Provider Note  CSN: [id number] Arrival date & time: 09/24/23 2205  Chief Complaint(s) Laceration  HPI Louis Delgado is a 63 y.o. male here for laceration to the right pinky on the lateral aspect that occurred while at home around 9 PM.  Patient was washing dishes, broke a wine glass which cut him while he was trying to pick it up.  Patient is not on any anticoagulation.  Tetanus is up-to-date.  The history is provided by the patient.    Past Medical History Past Medical History:  Diagnosis Date   Bicuspid aortic valve    moderate aortic isufficiency   Chest pain    Gastroesophageal reflux disease    Hiatal hernia    History of kidney stones    Hypercholesterolemia    Hyperlipidemia    Hypertension    Kidney stones    Left ventricular hypertrophy    mild   Patient Active Problem List   Diagnosis Date Noted   Snoring 11/21/2015   Excessive daytime sleepiness 11/21/2015   Aortic insufficiency 05/30/2013   Hyperlipidemia 11/18/2010   Hypertension 11/18/2010   Home Medication(s) Prior to Admission medications   Medication Sig Start Date End Date Taking? Authorizing Provider  aspirin 81 MG tablet Take 81 mg by mouth at bedtime.    [provider]  Evolocumab  (REPATHA  SURECLICK) 140 MG/ML SOAJ Inject 140 mg into the skin every 14 (fourteen) days. 09/27/22   Nahser, Lela Purple, MD  metoprolol  tartrate (LOPRESSOR ) 25 MG tablet Take 1 tablet (25 mg total) by mouth 2 (two) times daily. 08/05/23   Nahser, Lela Purple, MD  rosuvastatin  (CRESTOR ) 20 MG tablet Take 1 tablet (20 mg total) by mouth daily. 07/18/23   Nahser, Lela Purple, MD                                                                                                                                    Allergies Crestor  [rosuvastatin ] and Hydrochlorothiazide  Review of Systems Review of Systems As noted in HPI  Physical Exam Vital Signs  I have reviewed  the triage vital signs BP (!) 161/87 (BP Location: Left Arm)   Pulse 66   Temp 98 F (36.7 C) (Oral)   Resp 14   SpO2 96%   Physical Exam Vitals reviewed.  Constitutional:      General: He is not in acute distress.    Appearance: He is well-developed. He is not diaphoretic.  HENT:     Head: Normocephalic and atraumatic.     Right Ear: External ear normal.     Left Ear: External ear normal.     Nose: Nose normal.     Mouth/Throat:     Mouth: Mucous membranes are moist.  Eyes:     General: No scleral icterus.    Conjunctiva/sclera: Conjunctivae normal.  Neck:     Trachea: Phonation normal.  Cardiovascular:  Rate and Rhythm: Normal rate and regular rhythm.  Pulmonary:     Effort: Pulmonary effort is normal. No respiratory distress.     Breath sounds: No stridor.  Abdominal:     General: There is no distension.  Musculoskeletal:        General: Normal range of motion.     Right hand: Laceration present.       Hands:     Cervical back: Normal range of motion.  Neurological:     Mental Status: He is alert and oriented to person, place, and time.  Psychiatric:        Behavior: Behavior normal.     ED Results and Treatments Labs (all labs ordered are listed, but only abnormal results are displayed) Labs Reviewed - No data to display                                                                                                                       EKG  EKG Interpretation Date/Time:    Ventricular Rate:    PR Interval:    QRS Duration:    QT Interval:    QTC Calculation:   R Axis:      Text Interpretation:         Radiology DG Finger Little Right Result Date: 09/24/2023 CLINICAL DATA:  Laceration from broken wine glass EXAM: RIGHT LITTLE FINGER 2+V COMPARISON:  None Available. FINDINGS: No acute fracture or dislocation is noted. No discrete foreign body is noted. IMPRESSION: No acute abnormality noted. Electronically Signed   By: Violeta Grey M.D.   On:  09/24/2023 23:23    Medications Ordered in ED Medications - No data to display Procedures .Laceration Repair  Date/Time: 09/25/2023 1:07 AM  Performed by: Lindle Rhea, MD Authorized by: Lindle Rhea, MD   Consent:    Consent obtained:  Verbal   Consent given by:  Patient   Risks discussed:  Pain, need for additional repair and infection Universal protocol:    Imaging studies available: yes     Patient identity confirmed:  Verbally with patient Anesthesia:    Anesthesia method:  None Laceration details:    Location:  Finger   Finger location:  R small finger   Length (cm):  1.2   Depth (mm):  2 Pre-procedure details:    Preparation:  Patient was prepped and draped in usual sterile fashion and imaging obtained to evaluate for foreign bodies Exploration:    Hemostasis achieved with:  Direct pressure   Imaging obtained: x-ray     Imaging outcome: foreign body not noted   Treatment:    Area cleansed with:  Saline   Amount of cleaning:  Extensive   Irrigation solution:  Sterile saline   Irrigation volume:  400cc   Irrigation method:  Pressure wash Skin repair:    Repair method:  Steri-Strips   Number of Steri-Strips:  1 Approximation:    Approximation:  Close Repair type:    Repair type:  Simple Post-procedure details:    Dressing:  Non-adherent dressing   Procedure completion:  Tolerated   (including critical care time) Medical Decision Making / ED Course   Medical Decision Making Amount and/or Complexity of Data Reviewed Radiology: ordered and independent interpretation performed. Decision-making details documented in ED Course.    Small finger laceration.  X-ray negative for retained foreign bodies. Thoroughly irrigated and closed as above.     Final Clinical Impression(s) / ED Diagnoses Final diagnoses:  Laceration of right little finger without foreign body without damage to nail, initial encounter   The patient appears reasonably  screened and/or stabilized for discharge and I doubt any other medical condition or other [facility] requiring further screening, evaluation, or treatment in the ED at this time. I have discussed the findings, Dx and Tx plan with the patient/family who expressed understanding and agree(s) with the plan. Discharge instructions discussed at length. The patient/family was given strict return precautions who verbalized understanding of the instructions. No further questions at time of discharge.  Disposition: Discharge  Condition: Good  ED Discharge Orders     None        Follow Up: Primary care provider  Call  as needed    This chart was dictated using voice recognition software.  Despite best efforts to proofread,  errors can occur which can change the documentation meaning.    Lindle Rhea, MD 09/25/23 [phone]

## 2023-10-10 ENCOUNTER — Encounter: Payer: Self-pay | Admitting: Cardiovascular Disease

## 2023-10-11 ENCOUNTER — Ambulatory Visit (HOSPITAL_COMMUNITY)
Admission: RE | Admit: 2023-10-11 | Discharge: 2023-10-11 | Disposition: A | Discharge status: Home / Self Care | Source: Ambulatory Visit | Attending: Surgery | Admitting: Surgery

## 2023-10-11 DIAGNOSIS — I7121 Aneurysm of the ascending aorta, without rupture: Secondary | ICD-10-CM | POA: Diagnosis not present

## 2023-10-11 DIAGNOSIS — I700 Atherosclerosis of aorta: Secondary | ICD-10-CM | POA: Insufficient documentation

## 2023-10-11 DIAGNOSIS — I517 Cardiomegaly: Secondary | ICD-10-CM | POA: Diagnosis not present

## 2023-10-11 DIAGNOSIS — I2510 Atherosclerotic heart disease of native coronary artery without angina pectoris: Secondary | ICD-10-CM | POA: Diagnosis not present

## 2023-10-11 MED ORDER — IOHEXOL 350 MG/ML SOLN
75.0000 mL | Freq: Once | INTRAVENOUS | Status: AC | PRN
  Administered 2023-10-11: 75 mL via INTRAVENOUS

## 2023-10-12 ENCOUNTER — Ambulatory Visit

## 2023-10-12 NOTE — Patient Instructions (Signed)

## 2023-10-12 NOTE — Progress Notes (Signed)
 [address] [place] [address]             [id number]      Michaeal Davis [id number] 1961/01/25  History of Present Illness: Mr. 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 He has been followed closely by cardiology receiving echocardiograms dating back to 2008. Echocardiogram showed a 3.8cm ascending aorta in 2019 and a mildly dilated ascending aorta in 2020 measuring 4.0cm. Echocardiogram in 2022 recorded an aortic root of 4.6cm, echocardiogram 08/26/22 reported a 4.0cm aortic root, and echocardiogram 02/18/23 measured an aortic root of 4.6cm and an ascending aorta of 4.2cm. His moderate aortic insufficiency has remained stable since 2014. He saw Dr. Lucas 1 year ago and CTA had measured a 4.7cm aortic root and a 4.1cm ascending aortic aneurysm.  Today he reports he is currently switching cardiologists since Dr. Alveta is retiring. He denies chest pain, shortness of breath, dizziness, and LOC. He also denies lower extremity swelling. He remains active and exercises at [employer] 3x per week, this consists of mostly walking. He reports his blood pressure was 115/76 this AM and he monitors this regularly at home.  Current Outpatient Medications on File Prior to Visit  Medication Sig Dispense Refill   aspirin 81 MG tablet Take 81 mg by mouth at bedtime.     Evolocumab  (REPATHA  SURECLICK) 140 MG/ML SOAJ Inject 140 mg into the skin every 14 (fourteen) days. 2 mL 11   metoprolol  tartrate (LOPRESSOR ) 25 MG tablet Take 1 tablet (25 mg total) by mouth 2 (two) times daily. 180 tablet 3   rosuvastatin  (CRESTOR ) 20 MG tablet Take 1 tablet (20 mg total) by mouth daily. 90 tablet 0   No current facility-administered medications on file prior to visit.   Vitals: Today's Vitals   10/18/23 0904  BP: (!) 160/100   Pulse: 75  Resp: 20  SpO2: 97%  Weight: 211 lb (95.7 kg)  Height: 5' 11 (1.803 m)   Body mass index is 29.43 kg/m.  Physical Exam General: Alert and oriented, no acute distress Neuro: Grossly intact CV: Regular rate and rhythm, possible diastolic murmur Pulm: Clear to auscultation bilaterally GI: +BS, nontender Extremities: 2+ radial pulses bilaterally, no edema BLE  CTA Results: CLINICAL DATA:  Aortic aneurysm.   EXAM: CT ANGIOGRAPHY CHEST WITH CONTRAST   TECHNIQUE: Multidetector CT imaging of the chest was performed using the standard protocol during bolus administration of intravenous contrast. Multiplanar CT image reconstructions and MIPs were obtained to evaluate the vascular anatomy.   RADIATION DOSE REDUCTION: This exam was performed according to the departmental dose-optimization program which includes automated exposure control, adjustment of the mA and/or kV according to patient size and/or use of iterative reconstruction technique.   CONTRAST:  75mL OMNIPAQUE  IOHEXOL  350 MG/ML SOLN   COMPARISON:  [facility] 10/07/2022.   FINDINGS: Cardiovascular: Atherosclerotic calcification of the aorta with age advanced involvement of the left anterior descending and circumflex coronary arteries. Ascending aorta measures 4.0 cm (coronal image 114, series 603), stable. Heart is enlarged with left ventricular dilatation. No pericardial effusion.   Mediastinum/Nodes: No pathologically enlarged mediastinal, hilar or axillary lymph nodes. Esophagus is grossly unremarkable.   Lungs/Pleura: Image quality is degraded by expiratory phase imaging. Lungs are otherwise clear. No pleural fluid. Airway is unremarkable.   Upper Abdomen: Visualized portions of  the liver, gallbladder, adrenal glands, kidneys, spleen, pancreas, stomach and bowel are grossly unremarkable. No upper abdominal adenopathy.   Musculoskeletal: Degenerative changes in the spine.   Review of the MIP images  confirms the above findings.   IMPRESSION: 1. 4.0 cm ascending aortic aneurysm, stable. Recommend annual imaging followup by CTA or MRA. This recommendation follows 2010 ACCF/AHA/AATS/ACR/ASA/SCA/SCAI/SIR/STS/SVM Guidelines for the Diagnosis and Management of Patients with Thoracic Aortic Disease. Circulation. 2010; 121: Z733-z630. Aortic aneurysm NOS (ICD10-I71.9). 2.  Age advanced two vessel coronary artery calcification. 3.  Aortic atherosclerosis (ICD10-I70.0).     Electronically Signed   By: Newell Eke M.D.   On: 10/17/2023 14:07  Impression and Plan: Aortic root and ascending aortic aneurysm: Mr. Weinkauf presents to the clinic with a stable 4.0 cm ascending aortic aneurysm, aortic root was not measured on CTA but overall looks stable measuring 4.6-4.7cm to me. It does show an enlarged left ventricle. Echocardiogram 03/10/23 shows a tricuspid aortic valve with mild to moderate aortic regurgitation and mild left ventricular hypertrophy. We discussed the natural history and and risk factors for growth of ascending aortic aneurysms. We covered the importance of tight blood pressure control, refraining from lifting heavy objects, and avoiding fluoroquinolones. The patient is aware of signs and symptoms of aortic dissection and when to present to the emergency department. He does not yet reach surgical threshold of 5.5cm. Dr. Lucas recommended 1 year follow up for his aneurysm and 6 month echocardiograms. He has not gotten an echocardiogram since 02/18/23 and one is not scheduled so I will arrange for that as soon as possible and we will follow up. Plan to have the patient return to clinic in 1 year with CTA.   HTN: His blood pressure is elevated in the clinic today but was better controlled when he saw his cardiologist and he states he keeps a log of this BP at home and it is well controlled less than 130/80. I told him to reach out to cardiology or his PCP if this becomes consistently  elevated at home and he was agreeable.   Risk Modification:  Statin:  Rosuvastatin   Smoking cessation instruction/counseling given:  never smoker  Patient was counseled on importance of Blood Pressure Control.  Despite Medical intervention if the patient notices persistently elevated blood pressure readings.  They are instructed to contact their Primary Care Physician  Please avoid use of Fluoroquinolones as this can potentially increase your risk of Aortic Rupture and/or Dissection  Patient educated on signs and symptoms of Aortic Dissection, handout also provided in AVS  Con G[place]MAN [place], PA-C 10/12/23

## 2023-10-13 ENCOUNTER — Telehealth: Payer: Self-pay | Admitting: Pharmacy Technician

## 2023-10-13 ENCOUNTER — Encounter: Payer: Self-pay | Admitting: Cardiovascular Disease

## 2023-10-13 MED ORDER — REPATHA SURECLICK 140 MG/ML ~~LOC~~ SOAJ: 140.0000 mg | SUBCUTANEOUS | 11 refills | Status: DC

## 2023-10-13 NOTE — Telephone Encounter (Signed)
 Pharmacy Patient Advocate Encounter   Received notification from CoverMyMeds that prior authorization for Repatha  SureClick 140MG /ML auto-injectors is required/requested.   Insurance verification completed.   The patient is insured through [facility] .   Per test claim: PA required; PA submitted to above mentioned insurance via CoverMyMeds Key/confirmation #/EOC [id number] Status is pending

## 2023-10-14 NOTE — Telephone Encounter (Signed)
 Pharmacy Patient Advocate Encounter  Received notification from [facility] that Prior Authorization for repatha  140mg   has been APPROVED from 10/13/23 to 10/12/24 sent patient mychart   PA #/Case ID/Reference #: [id number]

## 2023-10-18 ENCOUNTER — Encounter: Payer: Self-pay | Admitting: Physician Assistant

## 2023-10-18 ENCOUNTER — Ambulatory Visit: Attending: Surgery | Admitting: Physician Assistant

## 2023-10-18 ENCOUNTER — Other Ambulatory Visit: Payer: Self-pay | Admitting: Surgery

## 2023-10-18 VITALS — BP 160/100 | HR 75 | Resp 20 | Ht 71.0 in | Wt 211.0 lb

## 2023-10-18 DIAGNOSIS — I7121 Aneurysm of the ascending aorta, without rupture: Secondary | ICD-10-CM | POA: Diagnosis not present

## 2023-11-29 ENCOUNTER — Ambulatory Visit (HOSPITAL_COMMUNITY)
Admission: RE | Admit: 2023-11-29 | Discharge: 2023-11-29 | Disposition: A | Discharge status: Home / Self Care | Source: Ambulatory Visit | Attending: Cardiology | Admitting: Cardiology

## 2023-11-29 DIAGNOSIS — I7121 Aneurysm of the ascending aorta, without rupture: Secondary | ICD-10-CM | POA: Diagnosis not present

## 2023-11-29 DIAGNOSIS — I351 Nonrheumatic aortic (valve) insufficiency: Secondary | ICD-10-CM | POA: Insufficient documentation

## 2023-11-29 LAB — ECHOCARDIOGRAM COMPLETE
Area-P 1/2: 2.84 cm2
Est EF: 50
P 1/2 time: 659 ms
S' Lateral: 3.8 cm

## 2023-11-29 MED ORDER — PERFLUTREN LIPID MICROSPHERE
2.0000 mL | INTRAVENOUS | Status: AC | PRN
  Administered 2023-11-29 (×2): 2 mL via INTRAVENOUS

## 2024-01-09 ENCOUNTER — Encounter: Payer: Self-pay | Admitting: Cardiovascular Disease

## 2024-01-16 ENCOUNTER — Telehealth: Payer: Self-pay

## 2024-01-16 NOTE — Telephone Encounter (Signed)
 Called with echocardiogram results from 11/29/2023. Discussed the results via phone call.  He does not currently have any symptoms of aortic insuffiencey at this time.  He continues to walk 3 miles 3 times a week.  Aortic root dilatation and ascending aortic aneurysm stable in size. Continue follow up with cardiology and TCTS as scheduled.   LVEF 50%, which is mildly decreased. This has changed in the past year. LVEF was 55-60% in 2024 Grade I DD Mild to moderate AI, aortic valve functionally bicuspid with fused right and noncoronary cusps  Trivial mitral valve regurgitation Mild dilatation of aortic root 43mm Mild dilatation of ascending aorta at 41mm

## 2024-02-03 ENCOUNTER — Telehealth: Payer: Self-pay | Admitting: Cardiovascular Disease

## 2024-02-03 DIAGNOSIS — I77819 Aortic ectasia, unspecified site: Secondary | ICD-10-CM

## 2024-02-03 NOTE — Telephone Encounter (Signed)
 Order was changed to Dr. Wonda.

## 2024-02-03 NOTE — Telephone Encounter (Signed)
 Dr Alveta originally ordered testing before retiring and pt states that the test needs new order by current provider and pt is not scheduled for 1 yr f/u as of yet please advise   MR ANGIO CHEST W WO CONTRAST

## 2024-02-09 DIAGNOSIS — R112 Nausea with vomiting, unspecified: Secondary | ICD-10-CM | POA: Diagnosis not present

## 2024-02-09 DIAGNOSIS — R509 Fever, unspecified: Secondary | ICD-10-CM | POA: Diagnosis not present

## 2024-02-09 DIAGNOSIS — R051 Acute cough: Secondary | ICD-10-CM | POA: Diagnosis not present

## 2024-02-09 DIAGNOSIS — R0981 Nasal congestion: Secondary | ICD-10-CM | POA: Diagnosis not present

## 2024-03-26 DIAGNOSIS — H9193 Unspecified hearing loss, bilateral: Secondary | ICD-10-CM | POA: Diagnosis not present

## 2024-03-26 DIAGNOSIS — R0981 Nasal congestion: Secondary | ICD-10-CM | POA: Diagnosis not present

## 2024-03-26 DIAGNOSIS — H903 Sensorineural hearing loss, bilateral: Secondary | ICD-10-CM | POA: Diagnosis not present

## 2024-03-26 DIAGNOSIS — Z0110 Encounter for examination of ears and hearing without abnormal findings: Secondary | ICD-10-CM | POA: Diagnosis not present

## 2024-03-26 DIAGNOSIS — H6123 Impacted cerumen, bilateral: Secondary | ICD-10-CM | POA: Diagnosis not present

## 2024-03-26 DIAGNOSIS — J342 Deviated nasal septum: Secondary | ICD-10-CM | POA: Diagnosis not present

## 2024-04-01 DIAGNOSIS — J069 Acute upper respiratory infection, unspecified: Secondary | ICD-10-CM | POA: Diagnosis not present

## 2024-04-01 DIAGNOSIS — R051 Acute cough: Secondary | ICD-10-CM | POA: Diagnosis not present

## 2024-04-01 DIAGNOSIS — B974 Respiratory syncytial virus as the cause of diseases classified elsewhere: Secondary | ICD-10-CM | POA: Diagnosis not present

## 2024-04-05 ENCOUNTER — Encounter: Payer: Self-pay | Admitting: *Deleted

## 2024-04-05 NOTE — Progress Notes (Signed)
 Louis Delgado                                          MRN: [id number]   04/05/2024   The [facility] Quality Team Specialist reviewed this patient medical record for the purposes of chart review for care gap closure. The following were reviewed: chart review for care gap closure-controlling blood pressure.    VBCI Quality Team

## 2024-04-06 DIAGNOSIS — B338 Other specified viral diseases: Secondary | ICD-10-CM | POA: Diagnosis not present

## 2024-05-04 ENCOUNTER — Telehealth: Payer: Self-pay

## 2024-05-04 ENCOUNTER — Telehealth: Payer: Self-pay | Admitting: Cardiovascular Disease

## 2024-05-04 ENCOUNTER — Encounter: Payer: Self-pay | Admitting: Cardiovascular Disease

## 2024-05-04 MED ORDER — REPATHA SURECLICK 140 MG/ML ~~LOC~~ SOAJ: 140.0000 mg | SUBCUTANEOUS | 6 refills | Status: AC

## 2024-05-04 NOTE — Telephone Encounter (Signed)
 Spoke with patient. Sent medication to preferred pharmacy. Patient is scheduled to est care with Wonda in April. Former [employer] pt. Rx needed due to new insurance with work

## 2024-05-04 NOTE — Telephone Encounter (Signed)
" °*  STAT* If patient is at the pharmacy, call can be transferred to refill team.   1. Which medications need to be refilled? (please list name of each medication and dose if known) Evolocumab  (REPATHA  SURECLICK) 140 MG/ML SOAJ    2. Would you like to learn more about the convenience, safety, & potential cost savings by using the [facility]?     3. Are you open to using the [facility] (Type [facility].   4. Which pharmacy/location (including street and city if local pharmacy) is medication to be sent to?  [id number] - [place], [place] - [address]     5. Do they need a 30 day or 90 day supply? 30   Patient is out of Injections  "

## 2024-05-04 NOTE — Telephone Encounter (Signed)
 Due to new insurance with employer, patient is needing new rx for Repatha . Medication sent. Patient is scheduled to est care with Wonda in April 2026. Refills included

## 2024-05-07 ENCOUNTER — Telehealth: Payer: Self-pay | Admitting: Pharmacy Technician

## 2024-05-07 ENCOUNTER — Encounter: Payer: Self-pay | Admitting: Cardiovascular Disease

## 2024-05-07 NOTE — Telephone Encounter (Signed)
 Pharmacy Patient Advocate Encounter   Received notification from CoverMyMeds that prior authorization for repatha  is required/requested.   Insurance verification completed.   The patient is insured through [facility].   Per test claim: PA required; PA submitted to above mentioned insurance via Latent Key/confirmation #/EOC [id number] Status is pending

## 2024-05-07 NOTE — Telephone Encounter (Signed)
 Pt c/o medication issue:  1. Name of Medication:   Evolocumab  (REPATHA  SURECLICK) 140 MG/ML SOAJ   2. How are you currently taking this medication (dosage and times per day)?   3. Are you having a reaction (difficulty breathing--STAT)?   4. What is your medication issue?   Patient stated this medication will need to be approved by his new insurance company - [employer], phone# [phone].  See also patient's MyChart note.

## 2024-05-07 NOTE — Telephone Encounter (Signed)
 Pt requesting a c/b with a update in regards to his refill.

## 2024-05-08 ENCOUNTER — Other Ambulatory Visit (HOSPITAL_COMMUNITY): Payer: Self-pay

## 2024-05-08 NOTE — Telephone Encounter (Signed)
 Pharmacy Patient Advocate Encounter  Received notification from [facility] that Prior Authorization for repatha  has been APPROVED from 05/08/24 to 05/07/25. Unable to obtain price due to refill too soon rejection, last fill date 05/04/24 next available fill date02/07/26   PA #/Case ID/Reference #: [id number]

## 2024-05-09 NOTE — Telephone Encounter (Signed)
 Rx sent on 11/6.

## 2024-08-06 ENCOUNTER — Ambulatory Visit: Admitting: Cardiovascular Disease
